# Patient Record
Sex: Female | Born: 1935 | Race: Black or African American | Hispanic: No | Marital: Single | Smoking: Former smoker
Health system: Southern US, Community
[De-identification: ages and names within clinical notes are randomized; demographics above are authoritative.]

## PROBLEM LIST (undated history)

## (undated) DIAGNOSIS — M1990 Unspecified osteoarthritis, unspecified site: Secondary | ICD-10-CM

## (undated) DIAGNOSIS — C801 Malignant (primary) neoplasm, unspecified: Secondary | ICD-10-CM

## (undated) DIAGNOSIS — N189 Chronic kidney disease, unspecified: Secondary | ICD-10-CM

## (undated) DIAGNOSIS — T7840XA Allergy, unspecified, initial encounter: Secondary | ICD-10-CM

## (undated) DIAGNOSIS — J449 Chronic obstructive pulmonary disease, unspecified: Secondary | ICD-10-CM

## (undated) DIAGNOSIS — F32A Depression, unspecified: Secondary | ICD-10-CM

## (undated) DIAGNOSIS — H409 Unspecified glaucoma: Secondary | ICD-10-CM

## (undated) DIAGNOSIS — F329 Major depressive disorder, single episode, unspecified: Secondary | ICD-10-CM

## (undated) DIAGNOSIS — J45909 Unspecified asthma, uncomplicated: Secondary | ICD-10-CM

## (undated) DIAGNOSIS — I10 Essential (primary) hypertension: Secondary | ICD-10-CM

## (undated) HISTORY — DX: Malignant (primary) neoplasm, unspecified: C80.1

## (undated) HISTORY — DX: Major depressive disorder, single episode, unspecified: F32.9

## (undated) HISTORY — DX: Depression, unspecified: F32.A

## (undated) HISTORY — DX: Unspecified osteoarthritis, unspecified site: M19.90

## (undated) HISTORY — DX: Essential (primary) hypertension: I10

## (undated) HISTORY — DX: Unspecified asthma, uncomplicated: J45.909

## (undated) HISTORY — DX: Chronic kidney disease, unspecified: N18.9

## (undated) HISTORY — DX: Unspecified glaucoma: H40.9

## (undated) HISTORY — DX: Allergy, unspecified, initial encounter: T78.40XA

## (undated) HISTORY — DX: Chronic obstructive pulmonary disease, unspecified: J44.9

---

## 1987-12-05 HISTORY — PX: BREAST LUMPECTOMY: SHX2

## 2005-12-21 ENCOUNTER — Ambulatory Visit: Payer: Self-pay | Admitting: Internal Medicine

## 2006-02-26 ENCOUNTER — Ambulatory Visit: Payer: Self-pay | Admitting: Internal Medicine

## 2007-12-06 LAB — HM COLONOSCOPY: HM Colonoscopy: NORMAL

## 2009-03-01 ENCOUNTER — Ambulatory Visit: Payer: Self-pay | Admitting: Internal Medicine

## 2010-03-30 ENCOUNTER — Ambulatory Visit: Payer: Self-pay | Admitting: Internal Medicine

## 2010-08-17 ENCOUNTER — Ambulatory Visit: Payer: Self-pay | Admitting: Internal Medicine

## 2010-11-18 ENCOUNTER — Emergency Department: Payer: Self-pay | Admitting: Emergency Medicine

## 2011-06-21 ENCOUNTER — Ambulatory Visit: Payer: Self-pay

## 2011-12-28 DIAGNOSIS — M1990 Unspecified osteoarthritis, unspecified site: Secondary | ICD-10-CM | POA: Diagnosis not present

## 2011-12-28 DIAGNOSIS — Z79899 Other long term (current) drug therapy: Secondary | ICD-10-CM | POA: Diagnosis not present

## 2011-12-28 DIAGNOSIS — J449 Chronic obstructive pulmonary disease, unspecified: Secondary | ICD-10-CM | POA: Diagnosis not present

## 2012-04-02 DIAGNOSIS — Z79899 Other long term (current) drug therapy: Secondary | ICD-10-CM | POA: Diagnosis not present

## 2012-04-02 DIAGNOSIS — J4489 Other specified chronic obstructive pulmonary disease: Secondary | ICD-10-CM | POA: Diagnosis not present

## 2012-04-02 DIAGNOSIS — Z8249 Family history of ischemic heart disease and other diseases of the circulatory system: Secondary | ICD-10-CM | POA: Diagnosis not present

## 2012-04-02 DIAGNOSIS — J449 Chronic obstructive pulmonary disease, unspecified: Secondary | ICD-10-CM | POA: Diagnosis not present

## 2012-08-15 DIAGNOSIS — I4901 Ventricular fibrillation: Secondary | ICD-10-CM | POA: Diagnosis not present

## 2012-08-15 DIAGNOSIS — I5022 Chronic systolic (congestive) heart failure: Secondary | ICD-10-CM | POA: Diagnosis not present

## 2012-08-15 DIAGNOSIS — J209 Acute bronchitis, unspecified: Secondary | ICD-10-CM | POA: Diagnosis not present

## 2012-08-15 DIAGNOSIS — Z79899 Other long term (current) drug therapy: Secondary | ICD-10-CM | POA: Diagnosis not present

## 2012-08-22 ENCOUNTER — Ambulatory Visit (HOSPITAL_COMMUNITY)
Admission: RE | Admit: 2012-08-22 | Discharge: 2012-08-22 | Disposition: A | Discharge status: Home / Self Care | Payer: Medicare Other | Source: Ambulatory Visit | Attending: Pulmonary Disease | Admitting: Pulmonary Disease

## 2012-08-22 DIAGNOSIS — I493 Ventricular premature depolarization: Secondary | ICD-10-CM

## 2012-08-22 DIAGNOSIS — I079 Rheumatic tricuspid valve disease, unspecified: Secondary | ICD-10-CM | POA: Diagnosis not present

## 2012-08-22 DIAGNOSIS — R0609 Other forms of dyspnea: Secondary | ICD-10-CM

## 2012-08-22 DIAGNOSIS — R0989 Other specified symptoms and signs involving the circulatory and respiratory systems: Secondary | ICD-10-CM | POA: Insufficient documentation

## 2012-08-22 DIAGNOSIS — R0602 Shortness of breath: Secondary | ICD-10-CM

## 2012-08-22 NOTE — Progress Notes (Signed)
  Echocardiogram 2D Echocardiogram has been performed.  Laura Nicholson 08/22/2012, 2:12 PM

## 2012-09-03 DIAGNOSIS — I5022 Chronic systolic (congestive) heart failure: Secondary | ICD-10-CM | POA: Diagnosis not present

## 2012-09-03 DIAGNOSIS — M1990 Unspecified osteoarthritis, unspecified site: Secondary | ICD-10-CM | POA: Diagnosis not present

## 2012-09-03 DIAGNOSIS — J449 Chronic obstructive pulmonary disease, unspecified: Secondary | ICD-10-CM | POA: Diagnosis not present

## 2012-09-03 DIAGNOSIS — Z79899 Other long term (current) drug therapy: Secondary | ICD-10-CM | POA: Diagnosis not present

## 2012-09-03 DIAGNOSIS — R9431 Abnormal electrocardiogram [ECG] [EKG]: Secondary | ICD-10-CM | POA: Diagnosis not present

## 2012-12-16 DIAGNOSIS — M1990 Unspecified osteoarthritis, unspecified site: Secondary | ICD-10-CM | POA: Diagnosis not present

## 2012-12-16 DIAGNOSIS — Z79899 Other long term (current) drug therapy: Secondary | ICD-10-CM | POA: Diagnosis not present

## 2012-12-16 DIAGNOSIS — J449 Chronic obstructive pulmonary disease, unspecified: Secondary | ICD-10-CM | POA: Diagnosis not present

## 2013-01-27 DIAGNOSIS — M19049 Primary osteoarthritis, unspecified hand: Secondary | ICD-10-CM | POA: Diagnosis not present

## 2013-01-27 DIAGNOSIS — J449 Chronic obstructive pulmonary disease, unspecified: Secondary | ICD-10-CM | POA: Diagnosis not present

## 2013-01-27 DIAGNOSIS — I10 Essential (primary) hypertension: Secondary | ICD-10-CM | POA: Diagnosis not present

## 2013-01-28 DIAGNOSIS — R0902 Hypoxemia: Secondary | ICD-10-CM | POA: Diagnosis not present

## 2013-01-28 DIAGNOSIS — R0602 Shortness of breath: Secondary | ICD-10-CM | POA: Diagnosis not present

## 2013-01-28 DIAGNOSIS — J449 Chronic obstructive pulmonary disease, unspecified: Secondary | ICD-10-CM | POA: Diagnosis not present

## 2013-04-30 DIAGNOSIS — H4010X0 Unspecified open-angle glaucoma, stage unspecified: Secondary | ICD-10-CM | POA: Diagnosis not present

## 2013-04-30 DIAGNOSIS — R0602 Shortness of breath: Secondary | ICD-10-CM | POA: Diagnosis not present

## 2013-04-30 DIAGNOSIS — J449 Chronic obstructive pulmonary disease, unspecified: Secondary | ICD-10-CM | POA: Diagnosis not present

## 2013-05-09 DIAGNOSIS — H4010X0 Unspecified open-angle glaucoma, stage unspecified: Secondary | ICD-10-CM | POA: Diagnosis not present

## 2013-09-01 DIAGNOSIS — R0902 Hypoxemia: Secondary | ICD-10-CM | POA: Diagnosis not present

## 2013-09-01 DIAGNOSIS — J449 Chronic obstructive pulmonary disease, unspecified: Secondary | ICD-10-CM | POA: Diagnosis not present

## 2013-09-01 DIAGNOSIS — R0609 Other forms of dyspnea: Secondary | ICD-10-CM | POA: Diagnosis not present

## 2013-10-02 DIAGNOSIS — R0902 Hypoxemia: Secondary | ICD-10-CM | POA: Diagnosis not present

## 2013-10-02 DIAGNOSIS — R0609 Other forms of dyspnea: Secondary | ICD-10-CM | POA: Diagnosis not present

## 2013-10-02 DIAGNOSIS — J449 Chronic obstructive pulmonary disease, unspecified: Secondary | ICD-10-CM | POA: Diagnosis not present

## 2013-10-06 DIAGNOSIS — J449 Chronic obstructive pulmonary disease, unspecified: Secondary | ICD-10-CM | POA: Diagnosis not present

## 2013-10-06 DIAGNOSIS — M19049 Primary osteoarthritis, unspecified hand: Secondary | ICD-10-CM | POA: Diagnosis not present

## 2013-10-06 DIAGNOSIS — Z853 Personal history of malignant neoplasm of breast: Secondary | ICD-10-CM | POA: Diagnosis not present

## 2013-10-06 DIAGNOSIS — I10 Essential (primary) hypertension: Secondary | ICD-10-CM | POA: Diagnosis not present

## 2013-10-20 DIAGNOSIS — J449 Chronic obstructive pulmonary disease, unspecified: Secondary | ICD-10-CM | POA: Diagnosis not present

## 2013-10-20 DIAGNOSIS — R0609 Other forms of dyspnea: Secondary | ICD-10-CM | POA: Diagnosis not present

## 2013-10-20 DIAGNOSIS — Z23 Encounter for immunization: Secondary | ICD-10-CM | POA: Diagnosis not present

## 2013-10-20 DIAGNOSIS — R0902 Hypoxemia: Secondary | ICD-10-CM | POA: Diagnosis not present

## 2013-11-04 ENCOUNTER — Ambulatory Visit: Payer: Self-pay | Admitting: Internal Medicine

## 2013-11-04 DIAGNOSIS — Z1231 Encounter for screening mammogram for malignant neoplasm of breast: Secondary | ICD-10-CM | POA: Diagnosis not present

## 2013-11-04 LAB — HM MAMMOGRAPHY: HM MAMMO: NORMAL

## 2013-11-07 DIAGNOSIS — H4010X0 Unspecified open-angle glaucoma, stage unspecified: Secondary | ICD-10-CM | POA: Diagnosis not present

## 2014-02-18 DIAGNOSIS — R5381 Other malaise: Secondary | ICD-10-CM | POA: Diagnosis not present

## 2014-02-18 DIAGNOSIS — R5383 Other fatigue: Secondary | ICD-10-CM | POA: Diagnosis not present

## 2014-02-18 DIAGNOSIS — J449 Chronic obstructive pulmonary disease, unspecified: Secondary | ICD-10-CM | POA: Diagnosis not present

## 2014-04-07 DIAGNOSIS — Z0000 Encounter for general adult medical examination without abnormal findings: Secondary | ICD-10-CM | POA: Diagnosis not present

## 2014-04-07 DIAGNOSIS — F3289 Other specified depressive episodes: Secondary | ICD-10-CM | POA: Diagnosis not present

## 2014-04-07 DIAGNOSIS — Z853 Personal history of malignant neoplasm of breast: Secondary | ICD-10-CM | POA: Diagnosis not present

## 2014-04-07 DIAGNOSIS — J449 Chronic obstructive pulmonary disease, unspecified: Secondary | ICD-10-CM | POA: Diagnosis not present

## 2014-04-07 DIAGNOSIS — F329 Major depressive disorder, single episode, unspecified: Secondary | ICD-10-CM | POA: Diagnosis not present

## 2014-04-07 DIAGNOSIS — I10 Essential (primary) hypertension: Secondary | ICD-10-CM | POA: Diagnosis not present

## 2014-04-07 LAB — BASIC METABOLIC PANEL
BUN: 23 mg/dL — AB (ref 4–21)
CREATININE: 1.3 mg/dL — AB (ref <=1.1)

## 2014-04-07 LAB — CBC AND DIFFERENTIAL: Hemoglobin: 13.6 g/dL (ref 12.0–16.0)

## 2014-04-07 LAB — TSH: TSH: 1.9 u[IU]/mL (ref <=5.90)

## 2014-04-07 LAB — LIPID PANEL
CHOLESTEROL: 183 mg/dL (ref 0–200)
HDL: 67 mg/dL (ref 35–70)
LDL Cholesterol: 97 mg/dL

## 2014-05-06 DIAGNOSIS — H4010X0 Unspecified open-angle glaucoma, stage unspecified: Secondary | ICD-10-CM | POA: Diagnosis not present

## 2014-05-20 DIAGNOSIS — R0989 Other specified symptoms and signs involving the circulatory and respiratory systems: Secondary | ICD-10-CM | POA: Diagnosis not present

## 2014-05-20 DIAGNOSIS — I951 Orthostatic hypotension: Secondary | ICD-10-CM | POA: Diagnosis not present

## 2014-05-20 DIAGNOSIS — R011 Cardiac murmur, unspecified: Secondary | ICD-10-CM | POA: Diagnosis not present

## 2014-05-20 DIAGNOSIS — R42 Dizziness and giddiness: Secondary | ICD-10-CM | POA: Diagnosis not present

## 2014-05-27 DIAGNOSIS — F329 Major depressive disorder, single episode, unspecified: Secondary | ICD-10-CM | POA: Diagnosis not present

## 2014-05-27 DIAGNOSIS — F3289 Other specified depressive episodes: Secondary | ICD-10-CM | POA: Diagnosis not present

## 2014-05-27 DIAGNOSIS — J309 Allergic rhinitis, unspecified: Secondary | ICD-10-CM | POA: Diagnosis not present

## 2014-05-27 DIAGNOSIS — R42 Dizziness and giddiness: Secondary | ICD-10-CM | POA: Diagnosis not present

## 2014-06-24 DIAGNOSIS — R0609 Other forms of dyspnea: Secondary | ICD-10-CM | POA: Diagnosis not present

## 2014-06-24 DIAGNOSIS — J449 Chronic obstructive pulmonary disease, unspecified: Secondary | ICD-10-CM | POA: Diagnosis not present

## 2014-06-24 DIAGNOSIS — R0902 Hypoxemia: Secondary | ICD-10-CM | POA: Diagnosis not present

## 2014-06-24 DIAGNOSIS — R0989 Other specified symptoms and signs involving the circulatory and respiratory systems: Secondary | ICD-10-CM | POA: Diagnosis not present

## 2014-07-28 ENCOUNTER — Encounter: Payer: Self-pay | Admitting: Specialist

## 2014-07-28 DIAGNOSIS — J449 Chronic obstructive pulmonary disease, unspecified: Secondary | ICD-10-CM | POA: Diagnosis not present

## 2014-07-28 DIAGNOSIS — Z5189 Encounter for other specified aftercare: Secondary | ICD-10-CM | POA: Diagnosis not present

## 2014-08-04 ENCOUNTER — Encounter: Payer: Self-pay | Admitting: Specialist

## 2014-08-04 DIAGNOSIS — J449 Chronic obstructive pulmonary disease, unspecified: Secondary | ICD-10-CM | POA: Diagnosis not present

## 2014-08-04 DIAGNOSIS — Z5189 Encounter for other specified aftercare: Secondary | ICD-10-CM | POA: Diagnosis not present

## 2014-08-05 DIAGNOSIS — Z5189 Encounter for other specified aftercare: Secondary | ICD-10-CM | POA: Diagnosis not present

## 2014-08-05 DIAGNOSIS — J449 Chronic obstructive pulmonary disease, unspecified: Secondary | ICD-10-CM | POA: Diagnosis not present

## 2014-08-20 DIAGNOSIS — R0609 Other forms of dyspnea: Secondary | ICD-10-CM | POA: Diagnosis not present

## 2014-08-20 DIAGNOSIS — R0902 Hypoxemia: Secondary | ICD-10-CM | POA: Diagnosis not present

## 2014-08-20 DIAGNOSIS — J449 Chronic obstructive pulmonary disease, unspecified: Secondary | ICD-10-CM | POA: Diagnosis not present

## 2015-05-06 DIAGNOSIS — J449 Chronic obstructive pulmonary disease, unspecified: Secondary | ICD-10-CM | POA: Diagnosis not present

## 2015-05-06 DIAGNOSIS — R600 Localized edema: Secondary | ICD-10-CM | POA: Diagnosis not present

## 2015-05-06 DIAGNOSIS — G4700 Insomnia, unspecified: Secondary | ICD-10-CM | POA: Diagnosis not present

## 2015-05-06 DIAGNOSIS — M1990 Unspecified osteoarthritis, unspecified site: Secondary | ICD-10-CM | POA: Diagnosis not present

## 2015-05-07 ENCOUNTER — Other Ambulatory Visit: Payer: Self-pay | Admitting: Internal Medicine

## 2015-05-08 ENCOUNTER — Encounter: Payer: Self-pay | Admitting: Internal Medicine

## 2015-05-08 ENCOUNTER — Other Ambulatory Visit: Payer: Self-pay | Admitting: Internal Medicine

## 2015-05-08 DIAGNOSIS — M19049 Primary osteoarthritis, unspecified hand: Secondary | ICD-10-CM | POA: Insufficient documentation

## 2015-05-08 DIAGNOSIS — Z853 Personal history of malignant neoplasm of breast: Secondary | ICD-10-CM | POA: Insufficient documentation

## 2015-05-08 DIAGNOSIS — I10 Essential (primary) hypertension: Secondary | ICD-10-CM | POA: Insufficient documentation

## 2015-05-08 DIAGNOSIS — F325 Major depressive disorder, single episode, in full remission: Secondary | ICD-10-CM | POA: Insufficient documentation

## 2015-05-08 DIAGNOSIS — J449 Chronic obstructive pulmonary disease, unspecified: Secondary | ICD-10-CM | POA: Insufficient documentation

## 2015-05-08 DIAGNOSIS — J309 Allergic rhinitis, unspecified: Secondary | ICD-10-CM | POA: Insufficient documentation

## 2015-05-08 DIAGNOSIS — D696 Thrombocytopenia, unspecified: Secondary | ICD-10-CM | POA: Insufficient documentation

## 2015-05-18 ENCOUNTER — Ambulatory Visit
Admission: RE | Admit: 2015-05-18 | Discharge: 2015-05-18 | Disposition: A | Discharge status: Home / Self Care | Payer: Medicare Other | Source: Ambulatory Visit | Attending: Internal Medicine | Admitting: Internal Medicine

## 2015-05-18 ENCOUNTER — Other Ambulatory Visit: Payer: Self-pay | Admitting: Internal Medicine

## 2015-05-18 DIAGNOSIS — J439 Emphysema, unspecified: Secondary | ICD-10-CM | POA: Diagnosis not present

## 2015-05-18 DIAGNOSIS — J449 Chronic obstructive pulmonary disease, unspecified: Secondary | ICD-10-CM

## 2015-05-18 DIAGNOSIS — R0602 Shortness of breath: Secondary | ICD-10-CM | POA: Diagnosis not present

## 2015-05-18 DIAGNOSIS — J9611 Chronic respiratory failure with hypoxia: Secondary | ICD-10-CM | POA: Diagnosis not present

## 2015-06-09 DIAGNOSIS — R0602 Shortness of breath: Secondary | ICD-10-CM | POA: Diagnosis not present

## 2015-06-14 DIAGNOSIS — J439 Emphysema, unspecified: Secondary | ICD-10-CM | POA: Diagnosis not present

## 2015-06-14 DIAGNOSIS — J449 Chronic obstructive pulmonary disease, unspecified: Secondary | ICD-10-CM | POA: Diagnosis not present

## 2015-06-14 DIAGNOSIS — J9611 Chronic respiratory failure with hypoxia: Secondary | ICD-10-CM | POA: Diagnosis not present

## 2015-07-29 DIAGNOSIS — H527 Unspecified disorder of refraction: Secondary | ICD-10-CM | POA: Diagnosis not present

## 2015-07-29 DIAGNOSIS — H40003 Preglaucoma, unspecified, bilateral: Secondary | ICD-10-CM | POA: Diagnosis not present

## 2015-07-29 DIAGNOSIS — H25813 Combined forms of age-related cataract, bilateral: Secondary | ICD-10-CM | POA: Diagnosis not present

## 2015-08-11 DIAGNOSIS — H40003 Preglaucoma, unspecified, bilateral: Secondary | ICD-10-CM | POA: Diagnosis not present

## 2015-08-11 DIAGNOSIS — H527 Unspecified disorder of refraction: Secondary | ICD-10-CM | POA: Diagnosis not present

## 2015-08-11 DIAGNOSIS — H25813 Combined forms of age-related cataract, bilateral: Secondary | ICD-10-CM | POA: Diagnosis not present

## 2015-08-17 DIAGNOSIS — J45909 Unspecified asthma, uncomplicated: Secondary | ICD-10-CM | POA: Diagnosis not present

## 2015-08-17 DIAGNOSIS — Z79899 Other long term (current) drug therapy: Secondary | ICD-10-CM | POA: Diagnosis not present

## 2015-08-17 DIAGNOSIS — I10 Essential (primary) hypertension: Secondary | ICD-10-CM | POA: Diagnosis not present

## 2015-08-17 DIAGNOSIS — H25811 Combined forms of age-related cataract, right eye: Secondary | ICD-10-CM | POA: Diagnosis not present

## 2015-08-17 DIAGNOSIS — M1990 Unspecified osteoarthritis, unspecified site: Secondary | ICD-10-CM | POA: Diagnosis not present

## 2015-08-17 DIAGNOSIS — Z87891 Personal history of nicotine dependence: Secondary | ICD-10-CM | POA: Diagnosis not present

## 2015-08-17 DIAGNOSIS — H25819 Combined forms of age-related cataract, unspecified eye: Secondary | ICD-10-CM | POA: Insufficient documentation

## 2015-08-17 DIAGNOSIS — H40003 Preglaucoma, unspecified, bilateral: Secondary | ICD-10-CM | POA: Diagnosis not present

## 2015-08-17 DIAGNOSIS — H527 Unspecified disorder of refraction: Secondary | ICD-10-CM | POA: Diagnosis not present

## 2015-09-21 DIAGNOSIS — J449 Chronic obstructive pulmonary disease, unspecified: Secondary | ICD-10-CM | POA: Diagnosis not present

## 2015-09-21 DIAGNOSIS — J9611 Chronic respiratory failure with hypoxia: Secondary | ICD-10-CM | POA: Diagnosis not present

## 2015-09-21 DIAGNOSIS — R0602 Shortness of breath: Secondary | ICD-10-CM | POA: Diagnosis not present

## 2015-09-27 DIAGNOSIS — Z23 Encounter for immunization: Secondary | ICD-10-CM | POA: Diagnosis not present

## 2015-11-02 DIAGNOSIS — R202 Paresthesia of skin: Secondary | ICD-10-CM | POA: Diagnosis not present

## 2016-01-03 DIAGNOSIS — Z961 Presence of intraocular lens: Secondary | ICD-10-CM | POA: Diagnosis not present

## 2016-01-03 DIAGNOSIS — H25812 Combined forms of age-related cataract, left eye: Secondary | ICD-10-CM | POA: Diagnosis not present

## 2016-01-03 DIAGNOSIS — H401131 Primary open-angle glaucoma, bilateral, mild stage: Secondary | ICD-10-CM | POA: Diagnosis not present

## 2016-01-03 DIAGNOSIS — H527 Unspecified disorder of refraction: Secondary | ICD-10-CM | POA: Diagnosis not present

## 2016-02-04 ENCOUNTER — Ambulatory Visit (INDEPENDENT_AMBULATORY_CARE_PROVIDER_SITE_OTHER): Payer: Self-pay | Admitting: Family Medicine

## 2016-02-04 ENCOUNTER — Encounter: Payer: Self-pay | Admitting: Family Medicine

## 2016-02-04 VITALS — BP 136/90 | HR 92 | Temp 98.3°F | Resp 16 | Ht 64.0 in | Wt 147.0 lb

## 2016-02-04 DIAGNOSIS — Z13820 Encounter for screening for osteoporosis: Secondary | ICD-10-CM | POA: Diagnosis not present

## 2016-02-04 DIAGNOSIS — Z7689 Persons encountering health services in other specified circumstances: Secondary | ICD-10-CM

## 2016-02-04 DIAGNOSIS — H409 Unspecified glaucoma: Secondary | ICD-10-CM | POA: Diagnosis not present

## 2016-02-04 DIAGNOSIS — R011 Cardiac murmur, unspecified: Secondary | ICD-10-CM | POA: Diagnosis not present

## 2016-02-04 DIAGNOSIS — R0902 Hypoxemia: Secondary | ICD-10-CM | POA: Diagnosis not present

## 2016-02-04 DIAGNOSIS — J449 Chronic obstructive pulmonary disease, unspecified: Secondary | ICD-10-CM | POA: Diagnosis not present

## 2016-02-04 DIAGNOSIS — I10 Essential (primary) hypertension: Secondary | ICD-10-CM

## 2016-02-04 DIAGNOSIS — J9612 Chronic respiratory failure with hypercapnia: Secondary | ICD-10-CM

## 2016-02-04 DIAGNOSIS — F325 Major depressive disorder, single episode, in full remission: Secondary | ICD-10-CM | POA: Diagnosis not present

## 2016-02-04 DIAGNOSIS — Z7189 Other specified counseling: Secondary | ICD-10-CM | POA: Diagnosis not present

## 2016-02-04 DIAGNOSIS — J9610 Chronic respiratory failure, unspecified whether with hypoxia or hypercapnia: Secondary | ICD-10-CM | POA: Insufficient documentation

## 2016-02-04 DIAGNOSIS — Z1211 Encounter for screening for malignant neoplasm of colon: Secondary | ICD-10-CM

## 2016-02-04 DIAGNOSIS — J301 Allergic rhinitis due to pollen: Secondary | ICD-10-CM | POA: Diagnosis not present

## 2016-02-04 NOTE — Assessment & Plan Note (Signed)
Appears to be doing well. Takes PRN Tranxene as prescribed by previous provider. Takes very occasionally. 1 bottle per year.

## 2016-02-04 NOTE — Assessment & Plan Note (Signed)
Managed by pulmonology. Wears 2L [place] for hypoxia.

## 2016-02-04 NOTE — Assessment & Plan Note (Signed)
On O2 2LPM daily.

## 2016-02-04 NOTE — Patient Instructions (Signed)
We will get you over to cardiology for evaluation of the heart murmur to have them do an echo.   Please continue to follow with Dr. Humphrey Rolls for your lungs.   The cologuard will be sent to your house. Please follow the instructions.   We will call you about getting your Dexascan set up to look for osteoporosis.   Please check your blood pressure at home. We will recheck in 1 mos.   Your goal blood pressure is 140/90 Work on low salt/sodium diet - goal <1.5gm (1,500mg ) per day. Eat a diet high in fruits/vegetables and whole grains.  Look into mediterranean and DASH diet. Goal activity is 123min/wk of moderate intensity exercise.  This can be split into 30 minute chunks.  If you are not at this level, you can start with smaller 10-15 min increments and slowly build up activity. Look at [email or web address] for more resources

## 2016-02-04 NOTE — Assessment & Plan Note (Signed)
Controlled.  

## 2016-02-04 NOTE — Assessment & Plan Note (Signed)
Controlled without HCTZ. Encouraged pt to check on a regular basis and will review next month. Check CMET. Consider ACE for BP control .

## 2016-02-04 NOTE — Assessment & Plan Note (Signed)
Managed by opthamology.

## 2016-02-04 NOTE — Assessment & Plan Note (Signed)
Followed by Dr. Humphrey Rolls. Seen Q3 mos.

## 2016-02-04 NOTE — Progress Notes (Signed)
Subjective:    Patient ID: Laura Nicholson, female    DOB: 1931/12/10, 82 y.o.   MRN: [id number]  HPI: Laura Nicholson is a 82 y.o. female presenting on 02/04/2016 for Establish Care   HPI  Pt presents to establish care today. [facility]  It has been 2 years since Her last PCP visit. Records from previous provider will be requested and reviewed. Also seeing Dr. Humphrey Rolls for pulmonology. Sees every 3 mos for COPD. Next visit march 8. Current medical problems include:  COPD: Managed by pulmonogy. Diagnosed years ago. On oxygen- since 2003. On 2 LPM- uses as needed for for the last 5-6 years she has used daily. Does not wear to sleep. Using symbicort and albuterol. Using albuterol 3-4 times per day. Symbiocort BID. No productive cough on a regular basis. Short of breath with minimal exertion. Was told she had a mild heart condition. Last EF  60% in 2013 Seasonal allergies: Controlled. Takes OTC meds when needed.  Pedal Edema: Occasional and mild.  Arthritis: Feel in 2011 and broke her arm. Has some arthritis from the previous injury. Occasional knee pain. Takes icy hot to help. Controls her pain.  Depression:  Takes tranxene as needed. Prescribed by Dr. Katherine Roan in [place].He was a previous primary care provider. Last refilled 6 mos ago.  Glaucoma: Dr. Vickii Penna in [place] manages.  Hypertension: Was previously on HCTZ- is not taking due to medication reaction.  Breast cancer in 1989- lumpectomy. No abnormal mammograms since that time.   Health maintenance:  Shingles vaccination: Declines.  TDAP: unsure. Declines. Pneumovax: 2013.  Prevnar: Given 2015.  Colonoscopy: 2009- normal. Would like to screen with cologuard. Mammogram: Last 2 years ago. Normal. Declines further. Dexa scan: Desires.     Past Medical History  Diagnosis Date  . Allergy   . Arthritis   . Asthma   . Cancer ([place])     in past  . Depression   . COPD (chronic  obstructive pulmonary disease) ([place])   . Glaucoma   . Chronic kidney disease     in past  . Hypertension    Social History   Social History  . Marital Status: Single    Spouse Name: N/A  . Number of Children: N/A  . Years of Education: N/A   Occupational History  . Not on file.   Social History Main Topics  . Smoking status: Former Research scientist (life sciences)  . Smokeless tobacco: Not on file  . Alcohol Use: No  . Drug Use: No  . Sexual Activity: Not on file   Other Topics Concern  . Not on file   Social History Narrative   Family History  Problem Relation Age of Onset  . CAD Father   . Heart disease Father   . Asthma Sister    Current Outpatient Prescriptions on File Prior to Visit  Medication Sig  . albuterol (PROVENTIL HFA;VENTOLIN HFA) 108 (90 BASE) MCG/ACT inhaler Inhale 2 puffs into the lungs 4 (four) times daily as needed.  . budesonide-formoterol (SYMBICORT) 80-4.5 MCG/ACT inhaler Inhale 1 puff into the lungs 2 (two) times daily.  . clorazepate (TRANXENE) 3.75 MG tablet Take 1 tablet by mouth 2 (two) times daily. Pt takes it as needed  . ibuprofen (ADVIL,MOTRIN) 800 MG tablet Take 1 tablet by mouth 2 (two) times daily as needed.  Marland Kitchen ipratropium-albuterol (DUONEB) 0.5-2.5 (3) MG/3ML SOLN Inhale 3 mLs into the lungs QID.  Marland Kitchen hydrochlorothiazide (HYDRODIURIL) 12.5 MG tablet TAKE ONE  TABLET BY MOUTH ONCE DAILY (Patient not taking: Reported on 02/04/2016)   No current facility-administered medications on file prior to visit.    Review of Systems  Constitutional: Negative for fever and chills.  HENT: Negative.   Respiratory: Positive for shortness of breath. Negative for cough, chest tightness and wheezing.   Cardiovascular: Negative for chest pain and leg swelling.  Gastrointestinal: Negative for nausea, vomiting, abdominal pain, diarrhea and constipation.  Endocrine: Negative.  Negative for cold intolerance, heat intolerance, polydipsia, polyphagia and polyuria.  Genitourinary:  Negative for dysuria and difficulty urinating.  Musculoskeletal: Positive for arthralgias (bilateral knees).  Neurological: Negative for dizziness, light-headedness and numbness.  Psychiatric/Behavioral: Negative.    Per HPI unless specifically indicated above     Objective:    BP 136/90 mmHg  Pulse 92  Temp(Src) 98.3 F (36.8 C) (Oral)  Resp 16  Ht 5\' 4"  (1.626 m)  Wt 147 lb (66.679 kg)  BMI 25.22 kg/m2  SpO2 93%  Wt Readings from Last 3 Encounters:  02/04/16 147 lb (66.679 kg)    Physical Exam  Constitutional: She is oriented to person, place, and time. She appears well-developed and well-nourished.  HENT:  Head: Normocephalic and atraumatic.  Neck: Neck supple.  Cardiovascular: Normal rate and regular rhythm.  Exam reveals no gallop and no friction rub.   Murmur heard.  Systolic murmur is present with a grade of 2/6  Pulses:      Radial pulses are 2+ on the right side, and 2+ on the left side.  2/6 systolic murmur heard best at LSB.   Pulmonary/Chest: Effort normal and breath sounds normal. No respiratory distress. She has no decreased breath sounds. She has no wheezes. She has no rhonchi. She has no rales. Chest wall is not dull to percussion. She exhibits no tenderness.  Abdominal: Soft. Normal appearance and bowel sounds are normal. She exhibits no distension and no mass. There is no tenderness. There is no rebound and no guarding.  Musculoskeletal: Normal range of motion. She exhibits no edema or tenderness.  Trace edema ankles.   Lymphadenopathy:    She has no cervical adenopathy.  Neurological: She is alert and oriented to person, place, and time.  Skin: Skin is warm and dry.  Psychiatric: She has a normal mood and affect. Her speech is normal and behavior is normal. Judgment and thought content normal. Cognition and memory are normal.   Results for orders placed or performed in visit on 05/08/15  HM MAMMOGRAPHY  Result Value Ref Range   HM Mammogram Normal     CBC and differential  Result Value Ref Range   Hemoglobin 13.6 12.0 - 16.0 g/dL  Basic metabolic panel  Result Value Ref Range   BUN 23 (A) 4 - 21 mg/dL   Creatinine 1.3 (A) .5 - 1.1 mg/dL  Lipid panel  Result Value Ref Range   Cholesterol 183 0 - 200 mg/dL   HDL 67 35 - 70 mg/dL   LDL Cholesterol 97 mg/dL  TSH  Result Value Ref Range   TSH 1.90 .41 - 5.90 uIU/mL  HM COLONOSCOPY  Result Value Ref Range   HM Colonoscopy Normal per patient at [place]:   Problem List Items Addressed This Visit      Cardiovascular and Mediastinum   Essential (primary) hypertension    Controlled without HCTZ. Encouraged pt to check on a regular basis and will review next month. Check CMET. Consider ACE for  BP control .       Relevant Orders   Comprehensive metabolic panel     Respiratory   Allergic rhinitis    Controlled.       COPD, severe ([place])    Followed by Dr. Humphrey Rolls. Seen Q3 mos.       Hypoxia    On O2 2LPM daily.       Chronic respiratory failure ([place])    Managed by pulmonology. Wears 2L [place] for hypoxia.         Other   Major depressive disorder, single episode in full remission ([place])    Appears to be doing well. Takes PRN Tranxene as prescribed by previous provider. Takes very occasionally. 1 bottle per year.        Glaucoma    Managed by opthamology.       Relevant Medications   latanoprost (XALATAN) 0.005 % ophthalmic solution    Other Visit Diagnoses    Encounter to establish care    -  Primary    Screening for osteoporosis        Pt desires dexa scan.     Relevant Orders    DG Bone Density    Screening for colon cancer        Relevant Orders    Cologuard    Cardiac murmur        Refer to cardiology for evaluation and management.     Relevant Orders    Ambulatory referral to Cardiology       Meds ordered this encounter  Medications  . latanoprost (XALATAN) 0.005 % ophthalmic solution    Sig: Place 1 drop into both eyes at  bedtime.      Follow up plan: Return in about 4 weeks (around 03/03/2016) for HTN.

## 2016-02-23 ENCOUNTER — Ambulatory Visit (INDEPENDENT_AMBULATORY_CARE_PROVIDER_SITE_OTHER): Payer: Medicare Other | Admitting: Cardiology

## 2016-02-23 ENCOUNTER — Encounter (INDEPENDENT_AMBULATORY_CARE_PROVIDER_SITE_OTHER): Payer: Self-pay

## 2016-02-23 ENCOUNTER — Encounter: Payer: Self-pay | Admitting: Cardiology

## 2016-02-23 VITALS — BP 152/77 | HR 91 | Ht 64.0 in | Wt 148.5 lb

## 2016-02-23 DIAGNOSIS — R011 Cardiac murmur, unspecified: Secondary | ICD-10-CM | POA: Diagnosis not present

## 2016-02-23 DIAGNOSIS — I10 Essential (primary) hypertension: Secondary | ICD-10-CM

## 2016-02-23 NOTE — Progress Notes (Signed)
Cardiology Office Note   Date:  02/23/2016   ID:  Laura Nicholson, DOB [birth year], MRN [id number]  Referring Doctor:  Letta Median, MD   Cardiologist:   Wende Bushy, MD   Reason for consultation:  Chief Complaint  Patient presents with  . other    Heart Murmur . Meds reviewed verbally with pt.      History of Present Illness: Laura Nicholson is a 82 y.o. female who presents for Evaluation for heart murmur. On office visits with PCP, murmur was appreciated. [facility] for cardiology evaluation. Patient does not complain of any chest pains, palpitations, headache, fever, cough, colds, abdominal pain,, PND, orthopnea, edema. She does have shortness breath but this improves with use of her inhalers.    ROS:  Please see the history of present illness. Aside from mentioned under HPI, all other systems are reviewed and negative.     Past Medical History  Diagnosis Date  . Allergy   . Arthritis   . Asthma   . Cancer ([place])     in past  . Depression   . COPD (chronic obstructive pulmonary disease) ([place])   . Glaucoma   . Chronic kidney disease     in past  . Hypertension     Past Surgical History  Procedure Laterality Date  . Breast lumpectomy Left 1989     reports that she has quit smoking. She does not have any smokeless tobacco history on file. She reports that she does not drink alcohol or use illicit drugs.   family history includes Asthma in her sister; CAD in her father; Heart disease in her father.   Current Outpatient Prescriptions  Medication Sig Dispense Refill  . albuterol (PROVENTIL HFA;VENTOLIN HFA) 108 (90 BASE) MCG/ACT inhaler Inhale 2 puffs into the lungs 4 (four) times daily as needed.    Marland Kitchen aspirin 325 MG tablet Take 325 mg by mouth every other day.    . budesonide-formoterol (SYMBICORT) 80-4.5 MCG/ACT inhaler Inhale 1 puff into the lungs 2 (two) times daily.    . clorazepate (TRANXENE) 3.75 MG tablet Take 1 tablet by mouth 2 (two) times daily.  Pt takes it as needed    . ibuprofen (ADVIL,MOTRIN) 800 MG tablet Take 1 tablet by mouth 2 (two) times daily as needed.    Marland Kitchen ipratropium-albuterol (DUONEB) 0.5-2.5 (3) MG/3ML SOLN Inhale 3 mLs into the lungs QID.    Marland Kitchen latanoprost (XALATAN) 0.005 % ophthalmic solution Place 1 drop into both eyes at bedtime.    . hydrochlorothiazide (HYDRODIURIL) 12.5 MG tablet TAKE ONE TABLET BY MOUTH ONCE DAILY (Patient not taking: Reported on 02/04/2016) 30 tablet 0   No current facility-administered medications for this visit.    Allergies: Review of patient's allergies indicates no known allergies.    PHYSICAL EXAM: VS:  BP 152/77 mmHg  Pulse 91  Ht 5\' 4"  (1.626 m)  Wt 148 lb 8 oz (67.359 kg)  BMI 25.48 kg/m2 , Body mass index is 25.48 kg/(m^2). Wt Readings from Last 3 Encounters:  02/23/16 148 lb 8 oz (67.359 kg)  02/04/16 147 lb (66.679 kg)    GENERAL:  well developed, well nourished, not in acute distress HEENT: normocephalic, pink conjunctivae, anicteric sclerae, no xanthelasma, normal dentition, oropharynx clear NECK:  no neck vein engorgement, JVP normal, no hepatojugular reflux, carotid upstroke brisk and symmetric, no bruit, no thyromegaly, no lymphadenopathy LUNGS:  good respiratory effort, clear to auscultation bilaterally CV:  PMI not displaced, no thrills, no lifts, S1 and  S2 within normal limits, no palpable S3 or S4, no rubs, no gallops, soft systolic murmur, nonradiating  ABD:  Soft, nontender, nondistended, normoactive bowel sounds, no abdominal aortic bruit, no hepatomegaly, no splenomegaly MS: nontender back, no kyphosis, no scoliosis, no joint deformities EXT:  2+ DP/PT pulses, no edema, no varicosities, no cyanosis, no clubbing SKIN: warm, nondiaphoretic, normal turgor, no ulcers NEUROPSYCH: alert, oriented to person, place, and time, sensory/motor grossly intact, normal mood, appropriate affect  Recent Labs: No results found for requested labs within last 365 days.   Lipid  Panel    Component Value Date/Time   CHOL 183 04/07/2014   HDL 67 04/07/2014   LDLCALC 97 04/07/2014     Other studies Reviewed:  EKG:  EKG is ordered today, 02/23/2016. The ekg ordered was personally reviewed by me and it reveals sinus rhythm, 91 BPM, presence of artifact.   Additional studies/ records that were reviewed personally reviewed by me today include: None available   ASSESSMENT AND PLAN:  Cardiac murmur, systolic Recommend echocardiogram for further evaluation  Hypertension Recommend blood pressure log. Continue to monitor   Current medicines are reviewed at length with the patient today.  The patient does not have concerns regarding medicines.  Labs/ tests ordered today include:  Orders Placed This Encounter  Procedures  . EKG 12-Lead  . Echocardiogram    I had a lengthy and detailed discussion with the patient regarding diagnoses, prognosis, diagnostic options, treatment options.  I counseled the patient on importance of lifestyle modification including heart healthy diet, regular physical activity.  Disposition:   FU with undersigned after tests   Signed, Wende Bushy, MD  02/23/2016 5:33 PM    [place]

## 2016-02-23 NOTE — Patient Instructions (Addendum)
Medication Instructions:  Your physician recommends that you continue on your current medications as directed. Please refer to the Current Medication list given to you today.   Labwork: None Ordered  Testing/Procedures: Your physician has requested that you have an echocardiogram. Echocardiography is a painless test that uses sound waves to create images of your heart. It provides your doctor with information about the size and shape of your heart and how well your heart's chambers and valves are working. This procedure takes approximately one hour. There are no restrictions for this procedure.  Date & Time: ________________________________________________________________  Follow-Up: Your physician recommends that you schedule a follow-up appointment after testing to review results.  Date & Time:__________________________________________________________________   Any Other Special Instructions Will Be Listed Below (If Applicable).     If you need a refill on your cardiac medications before your next appointment, please call your pharmacy.  Echocardiogram An echocardiogram, or echocardiography, uses sound waves (ultrasound) to produce an image of your heart. The echocardiogram is simple, painless, obtained within a short period of time, and offers valuable information to your health care provider. The images from an echocardiogram can provide information such as:  Evidence of coronary artery disease (CAD).  Heart size.  Heart muscle function.  Heart valve function.  Aneurysm detection.  Evidence of a past heart attack.  Fluid buildup around the heart.  Heart muscle thickening.  Assess heart valve function. LET [facility] CARE PROVIDER KNOW ABOUT:  Any allergies you have.  All medicines you are taking, including vitamins, herbs, eye drops, creams, and over-the-counter medicines.  Previous problems you or members of your family have had with the use of  anesthetics.  Any blood disorders you have.  Previous surgeries you have had.  Medical conditions you have.  Possibility of pregnancy, if this applies. BEFORE THE PROCEDURE  No special preparation is needed. Eat and drink normally.  PROCEDURE   In order to produce an image of your heart, gel will be applied to your chest and a wand-like tool (transducer) will be moved over your chest. The gel will help transmit the sound waves from the transducer. The sound waves will harmlessly bounce off your heart to allow the heart images to be captured in real-time motion. These images will then be recorded.  You may need an IV to receive a medicine that improves the quality of the pictures. AFTER THE PROCEDURE You may return to your normal schedule including diet, activities, and medicines, unless your health care provider tells you otherwise.   This information is not intended to replace advice given to you by your health care provider. Make sure you discuss any questions you have with your health care provider.   Document Released: 11/17/2000 Document Revised: 12/11/2014 Document Reviewed: 07/28/2013 Elsevier Interactive Patient Education [employer].

## 2016-02-25 ENCOUNTER — Encounter (INDEPENDENT_AMBULATORY_CARE_PROVIDER_SITE_OTHER): Payer: Self-pay

## 2016-02-25 ENCOUNTER — Ambulatory Visit (INDEPENDENT_AMBULATORY_CARE_PROVIDER_SITE_OTHER): Payer: Medicare Other

## 2016-02-25 ENCOUNTER — Other Ambulatory Visit: Payer: Self-pay

## 2016-02-25 DIAGNOSIS — R011 Cardiac murmur, unspecified: Secondary | ICD-10-CM

## 2016-02-28 ENCOUNTER — Telehealth: Payer: Self-pay | Admitting: *Deleted

## 2016-02-28 ENCOUNTER — Other Ambulatory Visit: Payer: Self-pay | Admitting: *Deleted

## 2016-02-28 DIAGNOSIS — R0602 Shortness of breath: Secondary | ICD-10-CM

## 2016-02-28 NOTE — Telephone Encounter (Signed)
-----   Message from Wende Bushy, MD sent at 02/28/2016 10:19 AM EDT ----- Please let patient know that overall heart function is normal. However, pressure inside the heart is elevated. Recommend further workup with CT A of the chest to rule out clot and lungs and to look at the lungs (PE). Before this is done, we will need to check her BMP and make sure her renal function is within the normal limits. Thank you.

## 2016-02-28 NOTE — Telephone Encounter (Signed)
Spoke with patient regarding echo results and need to schedule BMP and CT Angiogram. Let her know that we would be calling to schedule these appointments and she verbalized understanding and had no further questions at this time.

## 2016-02-29 DIAGNOSIS — J449 Chronic obstructive pulmonary disease, unspecified: Secondary | ICD-10-CM | POA: Diagnosis not present

## 2016-02-29 DIAGNOSIS — J9611 Chronic respiratory failure with hypoxia: Secondary | ICD-10-CM | POA: Diagnosis not present

## 2016-02-29 DIAGNOSIS — R0602 Shortness of breath: Secondary | ICD-10-CM | POA: Diagnosis not present

## 2016-03-02 NOTE — Telephone Encounter (Signed)
Spoke with patient regarding her CT angiogram. Provided her with appointment date, time, and location to have that done. March 14, 2016 at 9:00AM with nothing to eat or drink for 4 hours prior to test. She verbalized understanding of all instructions and let her know to call back if any questions.

## 2016-03-03 ENCOUNTER — Other Ambulatory Visit (INDEPENDENT_AMBULATORY_CARE_PROVIDER_SITE_OTHER): Payer: Medicare Other

## 2016-03-03 ENCOUNTER — Ambulatory Visit (INDEPENDENT_AMBULATORY_CARE_PROVIDER_SITE_OTHER): Payer: Medicare Other | Admitting: Family Medicine

## 2016-03-03 ENCOUNTER — Encounter: Payer: Self-pay | Admitting: Family Medicine

## 2016-03-03 VITALS — BP 157/80 | HR 101 | Temp 97.9°F | Resp 16 | Ht 64.0 in | Wt 145.0 lb

## 2016-03-03 DIAGNOSIS — J449 Chronic obstructive pulmonary disease, unspecified: Secondary | ICD-10-CM

## 2016-03-03 DIAGNOSIS — R0602 Shortness of breath: Secondary | ICD-10-CM

## 2016-03-03 DIAGNOSIS — I10 Essential (primary) hypertension: Secondary | ICD-10-CM | POA: Diagnosis not present

## 2016-03-03 MED ORDER — HYDROCHLOROTHIAZIDE 12.5 MG PO TABS: 12.5000 mg | ORAL_TABLET | Freq: Every day | ORAL | Status: DC

## 2016-03-03 NOTE — Assessment & Plan Note (Signed)
Elevated today. Restart HCTZ at 12.5mg . Encouraged pt to check BP at home daily. BMET pending from cardiology. Recheck 1 mos.

## 2016-03-03 NOTE — Assessment & Plan Note (Signed)
Stable today. Saw pulmonology on Tuesday- doing well.

## 2016-03-03 NOTE — Patient Instructions (Signed)
Your goal blood pressure is 150/90 Work on low salt/sodium diet - goal <1.5gm (1,500mg ) per day. Eat a diet high in fruits/vegetables and whole grains.  Look into mediterranean and DASH diet. Goal activity is 154min/wk of moderate intensity exercise.  This can be split into 30 minute chunks.  If you are not at this level, you can start with smaller 10-15 min increments and slowly build up activity. Look at [email or web address] for more resources  Please seek immediate medical attention at ER or Urgent Care if you develop: Chest pain, pressure or tightness. Shortness of breath accompanied by nausea or diaphoresis Visual changes Numbness or tingling on one side of the body Facial droop Altered mental status Or any concerning symptoms.

## 2016-03-03 NOTE — Progress Notes (Signed)
Subjective:    Patient ID: Laura Nicholson, female    DOB: 1932/04/28, 82 y.o.   MRN: [id number]  HPI: Laura Nicholson is a 82 y.o. female presenting on 03/03/2016 for Hypertension   HPI  Pt presents for follow-up of blood pressure.  Her BP is elevated today. She was taking HCTZ once daily to help with blood pressure but has since stopped the medication over past several years. BP elevated 152/77 at cardiology visit. She has a cuff at home but not checking regularly because she thinks it is not working. No Chest pain or HA. Had Echo with cardiology- no structural problems but did find elevated pressure in the heart. Has follow up CT scan on 4/11  Past Medical History  Diagnosis Date  . Allergy   . Arthritis   . Asthma   . Cancer ([place])     in past  . Depression   . COPD (chronic obstructive pulmonary disease) ([place])   . Glaucoma   . Chronic kidney disease     in past  . Hypertension     Current Outpatient Prescriptions on File Prior to Visit  Medication Sig  . albuterol (PROVENTIL HFA;VENTOLIN HFA) 108 (90 BASE) MCG/ACT inhaler Inhale 2 puffs into the lungs 4 (four) times daily as needed.  Marland Kitchen aspirin 325 MG tablet Take 325 mg by mouth every other day.  . budesonide-formoterol (SYMBICORT) 80-4.5 MCG/ACT inhaler Inhale 1 puff into the lungs 2 (two) times daily.  . clorazepate (TRANXENE) 3.75 MG tablet Take 1 tablet by mouth 2 (two) times daily. Pt takes it as needed  . ibuprofen (ADVIL,MOTRIN) 800 MG tablet Take 1 tablet by mouth 2 (two) times daily as needed.  Marland Kitchen ipratropium-albuterol (DUONEB) 0.5-2.5 (3) MG/3ML SOLN Inhale 3 mLs into the lungs QID.  Marland Kitchen latanoprost (XALATAN) 0.005 % ophthalmic solution Place 1 drop into both eyes at bedtime.   No current facility-administered medications on file prior to visit.    Review of Systems  Constitutional: Negative for fever and chills.  HENT: Negative.   Respiratory: Positive for shortness of breath (baseline- O2 dependent COPD).  Negative for cough, chest tightness and wheezing.   Cardiovascular: Negative for chest pain, palpitations and leg swelling.  Gastrointestinal: Negative for nausea, vomiting, abdominal pain, diarrhea and constipation.  Endocrine: Negative.  Negative for cold intolerance, heat intolerance, polydipsia, polyphagia and polyuria.  Genitourinary: Negative for dysuria and difficulty urinating.  Musculoskeletal: Negative.   Neurological: Negative for dizziness, light-headedness and numbness.  Psychiatric/Behavioral: Negative.    Per HPI unless specifically indicated above     Objective:    BP 157/80 mmHg  Pulse 101  Temp(Src) 97.9 F (36.6 C) (Oral)  Resp 16  Ht 5\' 4"  (1.626 m)  Wt 145 lb (65.772 kg)  BMI 24.88 kg/m2  SpO2 95%  Wt Readings from Last 3 Encounters:  03/03/16 145 lb (65.772 kg)  02/23/16 148 lb 8 oz (67.359 kg)  02/04/16 147 lb (66.679 kg)    Physical Exam  Constitutional: She is oriented to person, place, and time. She appears well-developed and well-nourished.  HENT:  Head: Normocephalic and atraumatic.  Neck: Neck supple.  Cardiovascular: Normal rate and regular rhythm.  Exam reveals no gallop and no friction rub.   Murmur heard.  Systolic murmur is present with a grade of 2/6  Murmur heard best LSB- does not radiate.   Pulmonary/Chest: Effort normal and breath sounds normal. She has no wheezes. She exhibits no tenderness.  Abdominal: Soft. Normal appearance and bowel  sounds are normal. She exhibits no distension and no mass. There is no tenderness. There is no rebound and no guarding.  Musculoskeletal: Normal range of motion. She exhibits no edema or tenderness.  Lymphadenopathy:    She has no cervical adenopathy.  Neurological: She is alert and oriented to person, place, and time.  Skin: Skin is warm and dry.   Results for orders placed or performed in visit on 05/08/15  HM MAMMOGRAPHY  Result Value Ref Range   HM Mammogram Normal   CBC and differential    Result Value Ref Range   Hemoglobin 13.6 12.0 - 16.0 g/dL  Basic metabolic panel  Result Value Ref Range   BUN 23 (A) 4 - 21 mg/dL   Creatinine 1.3 (A) .5 - 1.1 mg/dL  Lipid panel  Result Value Ref Range   Cholesterol 183 0 - 200 mg/dL   HDL 67 35 - 70 mg/dL   LDL Cholesterol 97 mg/dL  TSH  Result Value Ref Range   TSH 1.90 .41 - 5.90 uIU/mL  HM COLONOSCOPY  Result Value Ref Range   HM Colonoscopy Normal per patient at [facility]:   Problem List Items Addressed This Visit      Cardiovascular and Mediastinum   Essential (primary) hypertension - Primary    Elevated today. Restart HCTZ at 12.5mg . Encouraged pt to check BP at home daily. BMET pending from cardiology. Recheck 1 mos.       Relevant Medications   hydrochlorothiazide (HYDRODIURIL) 12.5 MG tablet     Respiratory   COPD, severe (HCC)    Stable today. Saw pulmonology on Tuesday- doing well.          Meds ordered this encounter  Medications  . hydrochlorothiazide (HYDRODIURIL) 12.5 MG tablet    Sig: Take 1 tablet (12.5 mg total) by mouth daily.    Dispense:  30 tablet    Refill:  5    Order Specific Question:  Supervising Provider    Answer:  Arlis Porta [id number]      Follow up plan: Return in about 4 weeks (around 03/31/2016) for HTN.

## 2016-03-04 LAB — BASIC METABOLIC PANEL
BUN/Creatinine Ratio: 18 (ref 11–26)
BUN: 16 mg/dL (ref 8–27)
CO2: 19 mmol/L (ref 18–29)
CREATININE: 0.9 mg/dL (ref 0.57–1.00)
Calcium: 9.1 mg/dL (ref 8.7–10.3)
Chloride: 108 mmol/L — ABNORMAL HIGH (ref 96–106)
GFR, EST AFRICAN AMERICAN: 69 mL/min/{1.73_m2} (ref >59)
GFR, EST NON AFRICAN AMERICAN: 60 mL/min/{1.73_m2} (ref >59)
Glucose: 95 mg/dL (ref 65–99)
Potassium: 4.5 mmol/L (ref 3.5–5.2)
SODIUM: 147 mmol/L — AB (ref 134–144)

## 2016-03-08 ENCOUNTER — Ambulatory Visit: Payer: PRIVATE HEALTH INSURANCE | Admitting: Cardiology

## 2016-03-09 DIAGNOSIS — M8588 Other specified disorders of bone density and structure, other site: Secondary | ICD-10-CM | POA: Diagnosis not present

## 2016-03-09 DIAGNOSIS — M85862 Other specified disorders of bone density and structure, left lower leg: Secondary | ICD-10-CM | POA: Diagnosis not present

## 2016-03-14 ENCOUNTER — Ambulatory Visit
Admission: RE | Admit: 2016-03-14 | Discharge: 2016-03-14 | Disposition: A | Discharge status: Home / Self Care | Payer: Medicare Other | Source: Ambulatory Visit | Attending: Cardiology | Admitting: Cardiology

## 2016-03-14 DIAGNOSIS — I272 Other secondary pulmonary hypertension: Secondary | ICD-10-CM | POA: Diagnosis not present

## 2016-03-14 DIAGNOSIS — R0602 Shortness of breath: Secondary | ICD-10-CM | POA: Diagnosis not present

## 2016-03-14 DIAGNOSIS — J449 Chronic obstructive pulmonary disease, unspecified: Secondary | ICD-10-CM | POA: Insufficient documentation

## 2016-03-14 DIAGNOSIS — J439 Emphysema, unspecified: Secondary | ICD-10-CM | POA: Diagnosis not present

## 2016-03-14 MED ORDER — IOPAMIDOL (ISOVUE-370) INJECTION 76%
75.0000 mL | Freq: Once | INTRAVENOUS | Status: AC | PRN
  Administered 2016-03-14: 75 mL via INTRAVENOUS

## 2016-03-23 ENCOUNTER — Ambulatory Visit (INDEPENDENT_AMBULATORY_CARE_PROVIDER_SITE_OTHER): Payer: Medicare Other | Admitting: Cardiology

## 2016-03-23 ENCOUNTER — Encounter: Payer: Self-pay | Admitting: Cardiology

## 2016-03-23 VITALS — BP 177/79 | HR 99 | Ht 64.0 in | Wt 145.0 lb

## 2016-03-23 DIAGNOSIS — I272 Other secondary pulmonary hypertension: Secondary | ICD-10-CM | POA: Diagnosis not present

## 2016-03-23 DIAGNOSIS — I10 Essential (primary) hypertension: Secondary | ICD-10-CM | POA: Diagnosis not present

## 2016-03-23 NOTE — Progress Notes (Signed)
Cardiology Office Note   Date:  03/23/2016   ID:  Laura Nicholson, DOB [birth year], MRN [id number]  Referring Doctor:  Leata Mouse, NP   Cardiologist:   Wende Bushy, MD   Reason for consultation:  Chief Complaint  Patient presents with  . other    F/u echo and labs. Meds reviewed verbally with pt.      History of Present Illness: Laura Nicholson is a 82 y.o. female who presents forFollow-up after test.    Patient does not complain of any chest pains, palpitations, headache, fever, cough, colds, abdominal pain,, PND, orthopnea, edema. She does have chronic shortness breath but this improves with use of her inhalers. She has been on oxygen for quite a few years now.   ROS:  Please see the history of present illness. Aside from mentioned under HPI, all other systems are reviewed and negative.     Past Medical History  Diagnosis Date  . Allergy   . Arthritis   . Asthma   . Depression   . COPD (chronic obstructive pulmonary disease) ([place])   . Glaucoma   . Chronic kidney disease     in past  . Hypertension   . Cancer ([place])     in past, 1989 Left Lumpectomy    Past Surgical History  Procedure Laterality Date  . Breast lumpectomy Left 1989     reports that she has quit smoking. She does not have any smokeless tobacco history on file. She reports that she does not drink alcohol or use illicit drugs.   family history includes Asthma in her sister; CAD in her father; Heart disease in her father.   Current Outpatient Prescriptions  Medication Sig Dispense Refill  . albuterol (PROVENTIL HFA;VENTOLIN HFA) 108 (90 BASE) MCG/ACT inhaler Inhale 2 puffs into the lungs 4 (four) times daily as needed.    Marland Kitchen aspirin 325 MG tablet Take 325 mg by mouth every other day.    . budesonide-formoterol (SYMBICORT) 80-4.5 MCG/ACT inhaler Inhale 1 puff into the lungs 2 (two) times daily.    . clorazepate (TRANXENE) 3.75 MG tablet Take 1 tablet by mouth 2 (two) times daily. Pt takes it as  needed    . hydrochlorothiazide (HYDRODIURIL) 12.5 MG tablet Take 1 tablet (12.5 mg total) by mouth daily. 30 tablet 5  . ibuprofen (ADVIL,MOTRIN) 800 MG tablet Take 1 tablet by mouth 2 (two) times daily as needed.    Marland Kitchen ipratropium-albuterol (DUONEB) 0.5-2.5 (3) MG/3ML SOLN Inhale 3 mLs into the lungs QID.    Marland Kitchen latanoprost (XALATAN) 0.005 % ophthalmic solution Place 1 drop into both eyes at bedtime.     No current facility-administered medications for this visit.    Allergies: Review of patient's allergies indicates no known allergies.    PHYSICAL EXAM: VS:  BP 177/79 mmHg  Pulse 99  Ht 5\' 4"  (1.626 m)  Wt 145 lb (65.772 kg)  BMI 24.88 kg/m2  SpO2 92% , Body mass index is 24.88 kg/(m^2). Wt Readings from Last 3 Encounters:  03/23/16 145 lb (65.772 kg)  03/03/16 145 lb (65.772 kg)  02/23/16 148 lb 8 oz (67.359 kg)    GENERAL:  well developed, well nourished, not in acute distress HEENT: normocephalic, pink conjunctivae, anicteric sclerae, no xanthelasma, normal dentition, oropharynx clear NECK:  no neck vein engorgement, JVP normal, no hepatojugular reflux, carotid upstroke brisk and symmetric, no bruit, no thyromegaly, no lymphadenopathy LUNGS:  good respiratory effort, clear to auscultation bilaterally CV:  PMI not  displaced, no thrills, no lifts, S1 and S2 within normal limits, no palpable S3 or S4, no rubs, no gallops, soft systolic murmur, nonradiating  ABD:  Soft, nontender, nondistended, normoactive bowel sounds, no abdominal aortic bruit, no hepatomegaly, no splenomegaly MS: nontender back, no kyphosis, no scoliosis, no joint deformities EXT:  2+ DP/PT pulses, no edema, no varicosities, no cyanosis, no clubbing SKIN: warm, nondiaphoretic, normal turgor, no ulcers NEUROPSYCH: alert, oriented to person, place, and time, sensory/motor grossly intact, normal mood, appropriate affect  Recent Labs: 03/03/2016: BUN 16; Creatinine, Ser 0.90; Potassium 4.5; Sodium 147*   Lipid  Panel    Component Value Date/Time   CHOL 183 04/07/2014   HDL 67 04/07/2014   LDLCALC 97 04/07/2014     Other studies Reviewed:  EKG:  EKG  02/23/2016. The ekg ordered was personally reviewed by me and it reveals sinus rhythm, 91 BPM, presence of artifact.   Additional studies/ records that were reviewed personally reviewed by me today include:  Echocardiogram 02/25/2016: Left ventricle: The cavity size was normal. Systolic function was  normal. The estimated ejection fraction was in the range of 55%  to 60%. Wall motion was normal; there were no regional wall  motion abnormalities. Doppler parameters are consistent with  abnormal left ventricular relaxation (grade 1 diastolic  dysfunction). - Left atrium: The atrium was normal in size. - Right ventricle: Systolic function was normal. - Pulmonary arteries: Systolic pressure was severely elevated. PA  peak pressure: 77 mm Hg (S).  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   ASSESSMENT AND PLAN:  Cardiac murmur, systolic May be related to TR, and pulmonary hypertension. Pulmonary hypertension is likely related to COPD/emphysema.  Hypertension Recommend blood pressure log. Agree with antihypertensive medication. Patient has a follow-up with PCP in a few weeks for monitoring of hypertension. Recommendation: May increase HCTZ, or try amlodipine, or try ARB. Patient reports that she was previously on benicar.   Pulmonary hypertension Likely in the setting of emphysema No evidence of CHF on exam.   Current medicines are reviewed at length with the patient today.  The patient does not have concerns regarding medicines.  Labs/ tests ordered today include:  No orders of the defined types were placed in this encounter.    I had a lengthy and detailed discussion with the patient regarding diagnoses, prognosis, diagnostic options, treatment  options.  I counseled the patient on importance of lifestyle modification including heart healthy diet, regular physical activity.  Disposition:   FU with undersigned in 6 months   Signed, Wende Bushy, MD  03/23/2016 3:32 PM    [facility]

## 2016-03-23 NOTE — Patient Instructions (Signed)
Medication Instructions:  Your physician recommends that you continue on your current medications as directed. Please refer to the Current Medication list given to you today.   Labwork: None ordered  Testing/Procedures: None ordered  Follow-Up: Your physician wants you to follow-up in: 6 months with Dr. Ingal. You will receive a reminder letter in the mail two months in advance. If you don't receive a letter, please call our office to schedule the follow-up appointment.   Any Other Special Instructions Will Be Listed Below (If Applicable).     If you need a refill on your cardiac medications before your next appointment, please call your pharmacy.   

## 2016-03-31 ENCOUNTER — Ambulatory Visit: Payer: Medicare Other | Admitting: Family Medicine

## 2016-04-03 ENCOUNTER — Encounter: Payer: Self-pay | Admitting: Family Medicine

## 2016-04-03 ENCOUNTER — Ambulatory Visit (INDEPENDENT_AMBULATORY_CARE_PROVIDER_SITE_OTHER): Payer: Medicare Other | Admitting: Family Medicine

## 2016-04-03 VITALS — BP 160/82 | HR 91 | Temp 98.4°F | Resp 16 | Ht 64.0 in | Wt 146.0 lb

## 2016-04-03 DIAGNOSIS — J449 Chronic obstructive pulmonary disease, unspecified: Secondary | ICD-10-CM

## 2016-04-03 DIAGNOSIS — I10 Essential (primary) hypertension: Secondary | ICD-10-CM

## 2016-04-03 NOTE — Assessment & Plan Note (Signed)
Start medication today. Encouraged pt to monitor BP daily. Recheck in 1 mos for BP control. Consider adding amlodipine or ACE/ARB for further control.  Try HCTZ first since patient has tolerated in the past.

## 2016-04-03 NOTE — Patient Instructions (Signed)
Start taking blood pressure medication once daily. This is waiting at [facility] for you.  Check your blood pressure once daily: Goal is >100/60 but less than 150/90  Work on low salt/sodium diet - goal <1.5gm (1,500mg ) per day. Eat a diet high in fruits/vegetables and whole grains.  Look into mediterranean and DASH diet. Goal activity is 122min/wk of moderate intensity exercise.  This can be split into 30 minute chunks.  If you are not at this level, you can start with smaller 10-15 min increments and slowly build up activity. Look at [email or web address] for more resources  Please seek immediate medical attention at ER or Urgent Care if you develop: Chest pain, pressure or tightness. Shortness of breath accompanied by nausea or diaphoresis Visual changes Numbness or tingling on one side of the body Facial droop Altered mental status Or any concerning symptoms.

## 2016-04-03 NOTE — Assessment & Plan Note (Signed)
Stable- no breathing issues at this time. Remains on home O2.

## 2016-04-03 NOTE — Progress Notes (Signed)
Subjective:    Patient ID: Laura Nicholson, female    DOB: June 22, 1932, 82 y.o.   MRN: [id number]  HPI: Laura Nicholson is a 82 y.o. female presenting on 04/03/2016 for Hypertension   HPI  Pt presents for follow-up of hypertension. She was placed on HCTZ 12.5mg  once daily at last visit but did not pick it up from her pharmacy. No chest pain. Baseline shortness of breath. No checking BP at home- has meter. Recently followed with cardiology for heart murmur.  COPD is stable- follows with Dr. Humphrey Rolls. On home O2.     Past Medical History  Diagnosis Date  . Allergy   . Arthritis   . Asthma   . Depression   . COPD (chronic obstructive pulmonary disease) ([place])   . Glaucoma   . Chronic kidney disease     in past  . Hypertension   . Cancer ([place])     in past, 1989 Left Lumpectomy    Current Outpatient Prescriptions on File Prior to Visit  Medication Sig  . albuterol (PROVENTIL HFA;VENTOLIN HFA) 108 (90 BASE) MCG/ACT inhaler Inhale 2 puffs into the lungs 4 (four) times daily as needed.  Marland Kitchen aspirin 325 MG tablet Take 325 mg by mouth every other day.  . budesonide-formoterol (SYMBICORT) 80-4.5 MCG/ACT inhaler Inhale 1 puff into the lungs 2 (two) times daily.  . clorazepate (TRANXENE) 3.75 MG tablet Take 1 tablet by mouth 2 (two) times daily. Pt takes it as needed  . hydrochlorothiazide (HYDRODIURIL) 12.5 MG tablet Take 1 tablet (12.5 mg total) by mouth daily.  Marland Kitchen ibuprofen (ADVIL,MOTRIN) 800 MG tablet Take 1 tablet by mouth 2 (two) times daily as needed.  Marland Kitchen ipratropium-albuterol (DUONEB) 0.5-2.5 (3) MG/3ML SOLN Inhale 3 mLs into the lungs QID.  Marland Kitchen latanoprost (XALATAN) 0.005 % ophthalmic solution Place 1 drop into both eyes at bedtime.   No current facility-administered medications on file prior to visit.    Review of Systems  Constitutional: Negative for fever and chills.  HENT: Negative.   Respiratory: Positive for shortness of breath (baseline- severe COPD on O2). Negative for cough,  chest tightness and wheezing.   Cardiovascular: Negative for chest pain and leg swelling.  Gastrointestinal: Negative for nausea, vomiting, abdominal pain, diarrhea and constipation.  Endocrine: Negative.  Negative for cold intolerance, heat intolerance, polydipsia, polyphagia and polyuria.  Genitourinary: Negative for dysuria and difficulty urinating.  Musculoskeletal: Negative.   Neurological: Negative for dizziness, light-headedness and numbness.  Psychiatric/Behavioral: Negative.    Per HPI unless specifically indicated above     Objective:    BP 160/82 mmHg  Pulse 91  Temp(Src) 98.4 F (36.9 C) (Oral)  Resp 16  Ht 5\' 4"  (1.626 m)  Wt 146 lb (66.225 kg)  BMI 25.05 kg/m2  SpO2 92%  Wt Readings from Last 3 Encounters:  04/03/16 146 lb (66.225 kg)  03/23/16 145 lb (65.772 kg)  03/03/16 145 lb (65.772 kg)    Physical Exam  Constitutional: She is oriented to person, place, and time. She appears well-developed and well-nourished.  HENT:  Head: Normocephalic and atraumatic.  Neck: Neck supple.  Cardiovascular: Normal rate, regular rhythm and normal pulses.  Exam reveals no gallop and no friction rub.   Murmur heard.  Systolic murmur is present with a grade of 2/6  Heard best at LSB.  Pulmonary/Chest: Effort normal and breath sounds normal. She has no wheezes. She exhibits no tenderness.  Abdominal: Soft. Normal appearance and bowel sounds are normal. She exhibits no distension and  no mass. There is no tenderness. There is no rebound and no guarding.  Musculoskeletal: Normal range of motion. She exhibits no edema or tenderness.       Right lower leg: Normal.       Left lower leg: Normal.  +2 pedal edema.   Lymphadenopathy:    She has no cervical adenopathy.  Neurological: She is alert and oriented to person, place, and time.  Skin: Skin is warm and dry.   Results for orders placed or performed in visit on 0000000  Basic Metabolic Panel (BMET)  Result Value Ref Range    Glucose 95 65 - 99 mg/dL   BUN 16 8 - 27 mg/dL   Creatinine, Ser 0.90 0.57 - 1.00 mg/dL   GFR calc non Af Amer 60 >59 mL/min/1.73   GFR calc Af Amer 69 >59 mL/min/1.73   BUN/Creatinine Ratio 18 11 - 26   Sodium 147 (H) 134 - 144 mmol/L   Potassium 4.5 3.5 - 5.2 mmol/L   Chloride 108 (H) 96 - 106 mmol/L   CO2 19 18 - 29 mmol/L   Calcium 9.1 8.7 - 10.3 mg/dL      Assessment & Plan:   Problem List Items Addressed This Visit      Cardiovascular and Mediastinum   Essential (primary) hypertension - Primary    Start medication today. Encouraged pt to monitor BP daily. Recheck in 1 mos for BP control. Consider adding amlodipine or ACE/ARB for further control.  Try HCTZ first since patient has tolerated in the past.         Respiratory   Severe chronic obstructive pulmonary disease (HCC)    Stable- no breathing issues at this time. Remains on home O2.         No orders of the defined types were placed in this encounter.      Follow up plan: Return in about 1 month (around 05/04/2016) for Bp check. Marland Kitchen

## 2016-04-19 ENCOUNTER — Telehealth: Payer: Self-pay | Admitting: Family Medicine

## 2016-04-19 NOTE — Telephone Encounter (Signed)
Pt called Wanted to know when her BP go down do she  Need to take her BP medication  ( BP 90/40, 99/48. Pt call back  # is [phone]

## 2016-04-20 NOTE — Telephone Encounter (Signed)
As per pt it was only yesterday low number I advised per Amy and suggested to keep log for blood pressure.

## 2016-04-20 NOTE — Telephone Encounter (Signed)
Please advise the patient to hold her BP medication if blood pressure is < 100/60. Is her blood pressure often that low? Thanks! AK

## 2016-05-08 ENCOUNTER — Ambulatory Visit (INDEPENDENT_AMBULATORY_CARE_PROVIDER_SITE_OTHER): Payer: Medicare Other | Admitting: Family Medicine

## 2016-05-08 VITALS — BP 115/67 | HR 93 | Temp 98.6°F | Resp 16 | Ht 64.0 in | Wt 142.0 lb

## 2016-05-08 DIAGNOSIS — J449 Chronic obstructive pulmonary disease, unspecified: Secondary | ICD-10-CM

## 2016-05-08 DIAGNOSIS — R7309 Other abnormal glucose: Secondary | ICD-10-CM | POA: Diagnosis not present

## 2016-05-08 DIAGNOSIS — I10 Essential (primary) hypertension: Secondary | ICD-10-CM | POA: Diagnosis not present

## 2016-05-08 LAB — BASIC METABOLIC PANEL WITH GFR
BUN: 18 mg/dL (ref 7–25)
CALCIUM: 9.1 mg/dL (ref 8.6–10.4)
CO2: 27 mmol/L (ref 20–31)
Chloride: 106 mmol/L (ref 98–110)
Creat: 0.92 mg/dL — ABNORMAL HIGH (ref 0.60–0.88)
GFR, EST AFRICAN AMERICAN: 67 mL/min (ref >=60)
GFR, EST NON AFRICAN AMERICAN: 58 mL/min — AB (ref >=60)
Glucose, Bld: 93 mg/dL (ref 65–99)
Potassium: 4.3 mmol/L (ref 3.5–5.3)
SODIUM: 143 mmol/L (ref 135–146)

## 2016-05-08 LAB — CBC WITH DIFFERENTIAL/PLATELET
BASOS PCT: 0 %
Basophils Absolute: 0 cells/uL (ref 0–200)
EOS PCT: 2 %
Eosinophils Absolute: 138 cells/uL (ref 15–500)
HCT: 44.6 % (ref 35.0–45.0)
HEMOGLOBIN: 13.9 g/dL (ref 11.7–15.5)
LYMPHS ABS: 1932 {cells}/uL (ref 850–3900)
Lymphocytes Relative: 28 %
MCH: 25.5 pg — ABNORMAL LOW (ref 27.0–33.0)
MCHC: 31.2 g/dL — AB (ref 32.0–36.0)
MCV: 81.7 fL (ref 80.0–100.0)
Monocytes Absolute: 621 cells/uL (ref 200–950)
Monocytes Relative: 9 %
NEUTROS ABS: 4209 {cells}/uL (ref 1500–7800)
NEUTROS PCT: 61 %
Platelets: 135 10*3/uL — ABNORMAL LOW (ref 140–400)
RBC: 5.46 MIL/uL — AB (ref 3.80–5.10)
RDW: 14.9 % (ref 11.0–15.0)
WBC: 6.9 10*3/uL (ref 3.8–10.8)

## 2016-05-08 LAB — HEMOGLOBIN A1C
Hgb A1c MFr Bld: 6.4 % — ABNORMAL HIGH (ref <5.7)
MEAN PLASMA GLUCOSE: 137 mg/dL

## 2016-05-08 MED ORDER — CHOLECALCIFEROL 10 MCG (400 UNIT) PO CAPS: 2.0000 | ORAL_CAPSULE | Freq: Every day | ORAL | Status: AC

## 2016-05-08 NOTE — Assessment & Plan Note (Signed)
Check CBC due to chronic hypoxia.

## 2016-05-08 NOTE — Assessment & Plan Note (Addendum)
BP controlled. Hold if <100/60 prior to taking medication. Check BP prior to taking medication. Recheck 3 mos. Call with persistent hypotension.

## 2016-05-08 NOTE — Progress Notes (Signed)
Subjective:    Patient ID: Laura Nicholson, female    DOB: Mar 12, 1932, 82 y.o.   MRN: [id number]  HPI: Laura Nicholson is a 82 y.o. female presenting on 05/08/2016 for Hypertension   HPI  Pt presents for blood pressure check. Her blood pressure is well controlled today. Checking blood pressure at home- avg 110-120/70 at home. Just taking HCTZ 12.5mg  once daily at home. No dizziness or chest pain.   Past Medical History  Diagnosis Date  . Allergy   . Arthritis   . Asthma   . Depression   . COPD (chronic obstructive pulmonary disease) ([place])   . Glaucoma   . Chronic kidney disease     in past  . Hypertension   . Cancer ([place])     in past, 1989 Left Lumpectomy    Current Outpatient Prescriptions on File Prior to Visit  Medication Sig  . albuterol (PROVENTIL HFA;VENTOLIN HFA) 108 (90 BASE) MCG/ACT inhaler Inhale 2 puffs into the lungs 4 (four) times daily as needed.  Marland Kitchen aspirin 325 MG tablet Take 325 mg by mouth every other day.  . budesonide-formoterol (SYMBICORT) 80-4.5 MCG/ACT inhaler Inhale 1 puff into the lungs 2 (two) times daily.  . clorazepate (TRANXENE) 3.75 MG tablet Take 1 tablet by mouth 2 (two) times daily. Pt takes it as needed  . hydrochlorothiazide (HYDRODIURIL) 12.5 MG tablet Take 1 tablet (12.5 mg total) by mouth daily.  Marland Kitchen ibuprofen (ADVIL,MOTRIN) 800 MG tablet Take 1 tablet by mouth 2 (two) times daily as needed.  Marland Kitchen ipratropium-albuterol (DUONEB) 0.5-2.5 (3) MG/3ML SOLN Inhale 3 mLs into the lungs QID.  Marland Kitchen latanoprost (XALATAN) 0.005 % ophthalmic solution Place 1 drop into both eyes at bedtime.   No current facility-administered medications on file prior to visit.    Review of Systems  Constitutional: Negative for fever and chills.  HENT: Negative.   Respiratory: Negative for cough, chest tightness and wheezing.   Cardiovascular: Negative for chest pain and leg swelling.  Gastrointestinal: Negative for nausea, vomiting, abdominal pain, diarrhea and  constipation.  Endocrine: Negative.  Negative for cold intolerance, heat intolerance, polydipsia, polyphagia and polyuria.  Genitourinary: Negative for dysuria and difficulty urinating.  Musculoskeletal: Negative.   Neurological: Negative for dizziness, light-headedness and numbness.  Psychiatric/Behavioral: Negative.    Per HPI unless specifically indicated above     Objective:    BP 115/67 mmHg  Pulse 93  Temp(Src) 98.6 F (37 C) (Oral)  Resp 16  Ht 5\' 4"  (1.626 m)  Wt 142 lb (64.411 kg)  BMI 24.36 kg/m2  SpO2 93%  Wt Readings from Last 3 Encounters:  05/08/16 142 lb (64.411 kg)  04/03/16 146 lb (66.225 kg)  03/23/16 145 lb (65.772 kg)    Physical Exam  Constitutional: She is oriented to person, place, and time. She appears well-developed and well-nourished.  HENT:  Head: Normocephalic and atraumatic.  Neck: Neck supple.  Cardiovascular: Normal rate, regular rhythm and normal heart sounds.  Exam reveals no gallop and no friction rub.   No murmur heard. Pulmonary/Chest: Effort normal and breath sounds normal. She has no wheezes. She exhibits no tenderness.  Abdominal: Soft. Normal appearance and bowel sounds are normal. She exhibits no distension and no mass. There is no tenderness. There is no rebound and no guarding.  Musculoskeletal: Normal range of motion. She exhibits no edema or tenderness.  Trace pedal edema bilateral feet.   Lymphadenopathy:    She has no cervical adenopathy.  Neurological: She is alert and oriented  to person, place, and time.  Skin: Skin is warm and dry.   Results for orders placed or performed in visit on 0000000  Basic Metabolic Panel (BMET)  Result Value Ref Range   Glucose 95 65 - 99 mg/dL   BUN 16 8 - 27 mg/dL   Creatinine, Ser 0.90 0.57 - 1.00 mg/dL   GFR calc non Af Amer 60 >59 mL/min/1.73   GFR calc Af Amer 69 >59 mL/min/1.73   BUN/Creatinine Ratio 18 11 - 26   Sodium 147 (H) 134 - 144 mmol/L   Potassium 4.5 3.5 - 5.2 mmol/L    Chloride 108 (H) 96 - 106 mmol/L   CO2 19 18 - 29 mmol/L   Calcium 9.1 8.7 - 10.3 mg/dL      Assessment & Plan:   Problem List Items Addressed This Visit      Cardiovascular and Mediastinum   Essential (primary) hypertension - Primary    BP controlled. Hold if <100/60 prior to taking medication. Check BP prior to taking medication. Recheck 3 mos. Call with persistent hypotension.       Relevant Orders   BASIC METABOLIC PANEL WITH GFR     Respiratory   COPD, severe (HCC)    Check CBC due to chronic hypoxia.       Relevant Orders   CBC with Differential/Platelet    Other Visit Diagnoses    Elevated glucose        Check hemoglobin A1c.     Relevant Orders    Hemoglobin A1c       Meds ordered this encounter  Medications  . Cholecalciferol 400 units CAPS    Sig: Take 2 capsules (800 Units total) by mouth daily.    Dispense:  30 each    Refill:  0    Order Specific Question:  Supervising Provider    Answer:  Arlis Porta [id number]      Follow up plan: Return in about 3 months (around 08/08/2016) for BP.

## 2016-05-08 NOTE — Patient Instructions (Addendum)
Stop blood pressure medication if your BP is < 100/60 or bottom number is <50 with any top number reading, don't take medication that day.   Goal: 20 minutes of weight bearing exercise (walking counts). 1200mg  of dietary calcium and 800IU vitamin D daily.

## 2016-05-09 ENCOUNTER — Telehealth: Payer: Self-pay | Admitting: Family Medicine

## 2016-05-09 MED ORDER — METFORMIN HCL 500 MG PO TABS: 500.0000 mg | ORAL_TABLET | Freq: Every day | ORAL | Status: DC

## 2016-05-09 NOTE — Telephone Encounter (Signed)
Sent to pharmacy on file. Patient made aware.

## 2016-05-09 NOTE — Telephone Encounter (Signed)
-----   Message from Jeanelle Malling, [place] sent at 05/09/2016  3:48 PM EDT ----- Advised pt she will cut down her sugar in diet but she also would like to try metformin if Jarome Trull think it's beneficiary.

## 2016-06-20 DIAGNOSIS — H25812 Combined forms of age-related cataract, left eye: Secondary | ICD-10-CM | POA: Diagnosis not present

## 2016-06-20 DIAGNOSIS — Z961 Presence of intraocular lens: Secondary | ICD-10-CM | POA: Diagnosis not present

## 2016-06-20 DIAGNOSIS — H43811 Vitreous degeneration, right eye: Secondary | ICD-10-CM | POA: Diagnosis not present

## 2016-06-20 DIAGNOSIS — H401131 Primary open-angle glaucoma, bilateral, mild stage: Secondary | ICD-10-CM | POA: Diagnosis not present

## 2016-06-27 DIAGNOSIS — J9611 Chronic respiratory failure with hypoxia: Secondary | ICD-10-CM | POA: Diagnosis not present

## 2016-06-27 DIAGNOSIS — J449 Chronic obstructive pulmonary disease, unspecified: Secondary | ICD-10-CM | POA: Diagnosis not present

## 2016-06-27 DIAGNOSIS — R0602 Shortness of breath: Secondary | ICD-10-CM | POA: Diagnosis not present

## 2016-08-15 ENCOUNTER — Ambulatory Visit: Payer: Medicare Other | Admitting: Family Medicine

## 2016-08-16 ENCOUNTER — Encounter: Payer: Self-pay | Admitting: Family Medicine

## 2016-08-16 ENCOUNTER — Ambulatory Visit: Payer: Medicare Other

## 2016-08-16 ENCOUNTER — Ambulatory Visit (INDEPENDENT_AMBULATORY_CARE_PROVIDER_SITE_OTHER): Payer: Medicare Other | Admitting: Family Medicine

## 2016-08-16 VITALS — BP 139/60 | HR 95 | Temp 98.3°F | Resp 16 | Ht 64.0 in | Wt 140.0 lb

## 2016-08-16 DIAGNOSIS — I10 Essential (primary) hypertension: Secondary | ICD-10-CM

## 2016-08-16 DIAGNOSIS — R630 Anorexia: Secondary | ICD-10-CM

## 2016-08-16 DIAGNOSIS — F325 Major depressive disorder, single episode, in full remission: Secondary | ICD-10-CM | POA: Diagnosis not present

## 2016-08-16 DIAGNOSIS — R634 Abnormal weight loss: Secondary | ICD-10-CM

## 2016-08-16 MED ORDER — LOSARTAN POTASSIUM 25 MG PO TABS: 25.0000 mg | ORAL_TABLET | Freq: Every day | ORAL | 11 refills | Status: AC

## 2016-08-16 MED ORDER — CLORAZEPATE DIPOTASSIUM 3.75 MG PO TABS: 3.7500 mg | ORAL_TABLET | Freq: Two times a day (BID) | ORAL | 1 refills | Status: AC

## 2016-08-16 NOTE — Assessment & Plan Note (Signed)
Renewed PRN Tranxene. Last fill was July of 2016. Pt uses sparingly and PRN. Possible depression as source of poor appetite- however will work up for organic causes.

## 2016-08-16 NOTE — Progress Notes (Signed)
Subjective:    Patient ID: Laura Nicholson, female    DOB: 11-15-1932, 82 y.o.   MRN: [id number]  HPI: Laura Nicholson is a 82 y.o. female presenting on 08/16/2016 for Hypertension (pt is feeling weak today)   HPI  Pt presents for follow-up for blood pressure. Is feeling a little run down today. No dizziness. No chest pain. No pre-syncope. Overall blood pressure is doing well. Feeling run down just today. Did not sleep well last night.  Concerned about 6lb weight loss. Lost her appetite. Appetite has been gone since May. Has been drinking ensure- yogurt and bananas.  Saw Dr. Humphrey Rolls last month for her breathing. Is worried it could be her blood pressure medication since it started when she started the HCTZ. However she does not take this medication daily. She has not taken in over 1 mos.   Past Medical History:  Diagnosis Date  . Allergy   . Arthritis   . Asthma   . Cancer ([place])    in past, 1989 Left Lumpectomy  . Chronic kidney disease    in past  . COPD (chronic obstructive pulmonary disease) ([place])   . Depression   . Glaucoma   . Hypertension     Current Outpatient Prescriptions on File Prior to Visit  Medication Sig  . albuterol (PROVENTIL HFA;VENTOLIN HFA) 108 (90 BASE) MCG/ACT inhaler Inhale 2 puffs into the lungs 4 (four) times daily as needed.  Marland Kitchen aspirin 325 MG tablet Take 325 mg by mouth every other day.  . budesonide-formoterol (SYMBICORT) 80-4.5 MCG/ACT inhaler Inhale 1 puff into the lungs 2 (two) times daily.  . Cholecalciferol 400 units CAPS Take 2 capsules (800 Units total) by mouth daily.  Marland Kitchen ibuprofen (ADVIL,MOTRIN) 800 MG tablet Take 1 tablet by mouth 2 (two) times daily as needed.  Marland Kitchen ipratropium-albuterol (DUONEB) 0.5-2.5 (3) MG/3ML SOLN Inhale 3 mLs into the lungs QID.  Marland Kitchen latanoprost (XALATAN) 0.005 % ophthalmic solution Place 1 drop into both eyes at bedtime.  . metFORMIN (GLUCOPHAGE) 500 MG tablet Take 1 tablet (500 mg total) by mouth daily with breakfast.   No  current facility-administered medications on file prior to visit.     Review of Systems  Constitutional: Positive for appetite change, fatigue and unexpected weight change. Negative for chills and fever.  HENT: Negative.   Respiratory: Negative for cough, chest tightness and wheezing.   Cardiovascular: Negative for chest pain and leg swelling.  Gastrointestinal: Negative for abdominal pain, constipation, diarrhea, nausea and vomiting.  Endocrine: Negative.  Negative for cold intolerance, heat intolerance, polydipsia, polyphagia and polyuria.  Genitourinary: Negative for difficulty urinating and dysuria.  Musculoskeletal: Negative.   Neurological: Negative for dizziness, light-headedness and numbness.  Psychiatric/Behavioral: Negative.    Per HPI unless specifically indicated above     Objective:    BP 139/60 (BP Location: Right Arm, Patient Position: Sitting, Cuff Size: Normal)   Pulse 95   Temp 98.3 F (36.8 C) (Oral)   Resp 16   Ht _0  (1.626 m)   Wt 140 lb (63.5 kg)   SpO2 98%   BMI 24.03 kg/m   Wt Readings from Last 3 Encounters:  08/16/16 140 lb (63.5 kg)  05/08/16 142 lb (64.4 kg)  04/03/16 146 lb (66.2 kg)    Physical Exam  Constitutional: She is oriented to person, place, and time. She appears well-developed and well-nourished.  HENT:  Head: Normocephalic and atraumatic.  Neck: Neck supple.  Cardiovascular: Normal rate and regular rhythm.  Exam reveals  no gallop and no friction rub.   Murmur (2/6 systolic murmur) heard. Pulmonary/Chest: Effort normal. She has decreased breath sounds in the right lower field and the left lower field. She has no wheezes. She exhibits no tenderness.  On 2L [place]  Abdominal: Soft. Normal appearance and bowel sounds are normal. She exhibits no distension and no mass. There is no tenderness. There is no rebound and no guarding.  Musculoskeletal: Normal range of motion. She exhibits no edema or tenderness.  Lymphadenopathy:    She has  no cervical adenopathy.  Neurological: She is alert and oriented to person, place, and time.  Skin: Skin is warm and dry.   Results for orders placed or performed in visit on 05/08/16  Hemoglobin A1c  Result Value Ref Range   Hgb A1c MFr Bld 6.4 (H) <5.7 %   Mean Plasma Glucose 137 mg/dL  CBC with Differential/Platelet  Result Value Ref Range   WBC 6.9 3.8 - 10.8 K/uL   RBC 5.46 (H) 3.80 - 5.10 MIL/uL   Hemoglobin 13.9 11.7 - 15.5 g/dL   HCT 44.6 35.0 - 45.0 %   MCV 81.7 80.0 - 100.0 fL   MCH 25.5 (L) 27.0 - 33.0 pg   MCHC 31.2 (L) 32.0 - 36.0 g/dL   RDW 14.9 11.0 - 15.0 %   Platelets 135 (L) 140 - 400 K/uL   MPV Not Performed 7.5 - 12.5 fL   Neutro Abs 4,209 1,500 - 7,800 cells/uL   Lymphs Abs 1,932 850 - 3,900 cells/uL   Monocytes Absolute 621 200 - 950 cells/uL   Eosinophils Absolute 138 15 - 500 cells/uL   Basophils Absolute 0 0 - 200 cells/uL   Neutrophils Relative % 61 %   Lymphocytes Relative 28 %   Monocytes Relative 9 %   Eosinophils Relative 2 %   Basophils Relative 0 %   Smear Review Criteria for review not met   BASIC METABOLIC PANEL WITH GFR  Result Value Ref Range   Sodium 143 135 - 146 mmol/L   Potassium 4.3 3.5 - 5.3 mmol/L   Chloride 106 98 - 110 mmol/L   CO2 27 20 - 31 mmol/L   Glucose, Bld 93 65 - 99 mg/dL   BUN 18 7 - 25 mg/dL   Creat 0.92 (H) 0.60 - 0.88 mg/dL   Calcium 9.1 8.6 - 10.4 mg/dL   GFR, Est African American 67 >=60 mL/min   GFR, Est Non African American 58 (L) >=60 mL/min      Assessment & Plan:   Problem List Items Addressed This Visit      Cardiovascular and Mediastinum   Essential (primary) hypertension    Controlled today. However will change to losartan 83m once daily since pt is experiencing anorexia with HCTZ.  Encouraged pt to take daily. Recheck 4 weeks to determine efficacy of losartan.       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 Last fill was July of 2016. Pt uses sparingly and PRN. Possible depression as source of poor appetite- however will work up for organic causes.       Relevant Medications   clorazepate (TRANXENE) 3.75 MG tablet    Other Visit Diagnoses    Weight loss    -  Primary   6lbs past 3 mos. Pt is concerned. Likely 2/2 poor appetite. Full lab  work up is pending.    Relevant Orders   CBC with Differential   COMPLETE METABOLIC PANEL WITH GFR   Poor appetite       Check CBC, CMP. Possibily related to patients severe COPD. Will request records from Dr. Humphrey Rolls. Recommend ensure daily. Change BP medication. Recheck 4 weeks.       Meds ordered this encounter  Medications  . clorazepate (TRANXENE) 3.75 MG tablet    Sig: Take 1 tablet (3.75 mg total) by mouth 2 (two) times daily. Pt takes it as needed    Dispense:  60 tablet    Refill:  1    Order Specific Question:   Supervising Provider    Answer:   Arlis Porta [[id number]]  . losartan (COZAAR) 25 MG tablet    Sig: Take 1 tablet (25 mg total) by mouth daily.    Dispense:  30 tablet    Refill:  11    Order Specific Question:   Supervising Provider    Answer:   Arlis Porta [phone]      Follow up plan: Return in about 4 weeks (around 09/13/2016), or if symptoms worsen or fail to improve, for Blood pressure and appetite. Marland Kitchen

## 2016-08-16 NOTE — Assessment & Plan Note (Signed)
Controlled today. However will change to losartan 25mg  once daily since pt is experiencing anorexia with HCTZ.  Encouraged pt to take daily. Recheck 4 weeks to determine efficacy of losartan.

## 2016-08-16 NOTE — Patient Instructions (Signed)
Blood pressure medication- We will change to Losartan to see if that helps with your appetite.  Poor appetite: We will check some lab work to determine if anything has changed that could be causing your symptoms.  Continue to add ensure and soup as you have been doing at home. This will help you maintain adequate nutrition.  To the ER for severe abdominal pain, nausea, vomiting, or other concerning symptoms.

## 2016-08-17 LAB — COMPLETE METABOLIC PANEL WITH GFR
ALT: 14 U/L (ref 6–29)
AST: 20 U/L (ref 10–35)
Albumin: 3.9 g/dL (ref 3.6–5.1)
Alkaline Phosphatase: 64 U/L (ref 33–130)
BUN: 20 mg/dL (ref 7–25)
CHLORIDE: 107 mmol/L (ref 98–110)
CO2: 26 mmol/L (ref 20–31)
Calcium: 9.3 mg/dL (ref 8.6–10.4)
Creat: 0.93 mg/dL — ABNORMAL HIGH (ref 0.60–0.88)
GFR, EST NON AFRICAN AMERICAN: 57 mL/min — AB (ref >=60)
GFR, Est African American: 66 mL/min (ref >=60)
GLUCOSE: 112 mg/dL — AB (ref 65–99)
POTASSIUM: 4.2 mmol/L (ref 3.5–5.3)
SODIUM: 142 mmol/L (ref 135–146)
Total Bilirubin: 0.6 mg/dL (ref 0.2–1.2)
Total Protein: 6.8 g/dL (ref 6.1–8.1)

## 2016-08-17 LAB — CBC WITH DIFFERENTIAL/PLATELET
BASOS ABS: 0 {cells}/uL (ref 0–200)
BASOS PCT: 0 %
EOS PCT: 1 %
Eosinophils Absolute: 69 cells/uL (ref 15–500)
HCT: 42.7 % (ref 35.0–45.0)
HEMOGLOBIN: 13.4 g/dL (ref 11.7–15.5)
LYMPHS ABS: 1794 {cells}/uL (ref 850–3900)
Lymphocytes Relative: 26 %
MCH: 25.8 pg — AB (ref 27.0–33.0)
MCHC: 31.4 g/dL — AB (ref 32.0–36.0)
MCV: 82.1 fL (ref 80.0–100.0)
MONOS PCT: 8 %
Monocytes Absolute: 552 cells/uL (ref 200–950)
NEUTROS ABS: 4485 {cells}/uL (ref 1500–7800)
NEUTROS PCT: 65 %
PLATELETS: 119 10*3/uL — AB (ref 140–400)
RBC: 5.2 MIL/uL — ABNORMAL HIGH (ref 3.80–5.10)
RDW: 15.5 % — ABNORMAL HIGH (ref 11.0–15.0)
WBC: 6.9 10*3/uL (ref 3.8–10.8)

## 2016-09-14 ENCOUNTER — Ambulatory Visit (INDEPENDENT_AMBULATORY_CARE_PROVIDER_SITE_OTHER): Payer: Medicare Other | Admitting: Family Medicine

## 2016-09-14 ENCOUNTER — Encounter: Payer: Self-pay | Admitting: Family Medicine

## 2016-09-14 VITALS — BP 138/62 | HR 93 | Temp 98.4°F | Resp 16 | Ht 64.0 in | Wt 137.0 lb

## 2016-09-14 DIAGNOSIS — D696 Thrombocytopenia, unspecified: Secondary | ICD-10-CM | POA: Diagnosis not present

## 2016-09-14 DIAGNOSIS — R7303 Prediabetes: Secondary | ICD-10-CM

## 2016-09-14 DIAGNOSIS — R634 Abnormal weight loss: Secondary | ICD-10-CM | POA: Diagnosis not present

## 2016-09-14 DIAGNOSIS — I10 Essential (primary) hypertension: Secondary | ICD-10-CM | POA: Diagnosis not present

## 2016-09-14 LAB — CBC WITH DIFFERENTIAL/PLATELET
BASOS ABS: 0 {cells}/uL (ref 0–200)
BASOS PCT: 0 %
EOS PCT: 2 %
Eosinophils Absolute: 158 cells/uL (ref 15–500)
HCT: 42.5 % (ref 35.0–45.0)
Hemoglobin: 13.6 g/dL (ref 11.7–15.5)
Lymphocytes Relative: 21 %
Lymphs Abs: 1659 cells/uL (ref 850–3900)
MCH: 26 pg — AB (ref 27.0–33.0)
MCHC: 32 g/dL (ref 32.0–36.0)
MCV: 81.3 fL (ref 80.0–100.0)
MONOS PCT: 10 %
Monocytes Absolute: 790 cells/uL (ref 200–950)
NEUTROS ABS: 5293 {cells}/uL (ref 1500–7800)
NEUTROS PCT: 67 %
PLATELETS: 170 10*3/uL (ref 140–400)
RBC: 5.23 MIL/uL — ABNORMAL HIGH (ref 3.80–5.10)
RDW: 15.4 % — ABNORMAL HIGH (ref 11.0–15.0)
WBC: 7.9 10*3/uL (ref 3.8–10.8)

## 2016-09-14 LAB — TSH: TSH: 1.33 m[IU]/L

## 2016-09-14 LAB — POCT GLYCOSYLATED HEMOGLOBIN (HGB A1C): HEMOGLOBIN A1C: 6.1

## 2016-09-14 NOTE — Assessment & Plan Note (Signed)
Recheck platelet count today. Appeared adequate last check. Plan for hematology consult if less than 120.

## 2016-09-14 NOTE — Patient Instructions (Signed)
Overall your blood pressure is doing well with the change to Losartan. We will stop your Metformin to determine if that was the source of the weight loss.  Continue to aim for 2 meals per day with an ensure or 3 meals per day. Eat well rounded meals.   Your goal blood pressure is 150/90. Work on low salt/sodium diet - goal <1.5gm (1,500mg ) per day. Eat a diet high in fruits/vegetables and whole grains.  Look into mediterranean and DASH diet. Goal activity is 164min/wk of moderate intensity exercise.  This can be split into 30 minute chunks.  If you are not at this level, you can start with smaller 10-15 min increments and slowly build up activity. Look at [place] for more resources

## 2016-09-14 NOTE — Assessment & Plan Note (Signed)
Controlled on recheck. Continue Losartan. Encouraged pt to take daily or to monitor BP closely. Pt is tolerating side effects much better with this medication. Check BMP for renal function today.

## 2016-09-14 NOTE — Progress Notes (Signed)
Subjective:    Patient ID: Laura Nicholson, female    DOB: 1932-05-26, 82 y.o.   MRN: [id number]  HPI: Evelen Janiak is a 82 y.o. female presenting on 09/14/2016 for Hypertension   HPI  Pt presents for follow-up of hypertension. Medications were switched due to lack of appetite. She reports her appetite has improved she has lost 3 more lbs since last visit- total loss of 5lbs since June- at that time she was started on Metformin. Eats 2-3 meals per day. Eats eggs and oatmeal for breakfast. Eats 2 veggies and meat for dinner. No blood in stools. No diarrhea. Nausea or vomiting. Pt is taking metformin daily to help with pre-diabetes.  Pt does not take BP medication every day. Did not take blood pressure medication this morning.   Past Medical History:  Diagnosis Date  . Allergy   . Arthritis   . Asthma   . Cancer ([place])    in past, 1989 Left Lumpectomy  . Chronic kidney disease    in past  . COPD (chronic obstructive pulmonary disease) ([place])   . Depression   . Glaucoma   . Hypertension     Current Outpatient Prescriptions on File Prior to Visit  Medication Sig  . albuterol (PROVENTIL HFA;VENTOLIN HFA) 108 (90 BASE) MCG/ACT inhaler Inhale 2 puffs into the lungs 4 (four) times daily as needed.  Marland Kitchen aspirin 325 MG tablet Take 325 mg by mouth every other day.  . budesonide-formoterol (SYMBICORT) 80-4.5 MCG/ACT inhaler Inhale 1 puff into the lungs 2 (two) times daily.  . Cholecalciferol 400 units CAPS Take 2 capsules (800 Units total) by mouth daily.  . clorazepate (TRANXENE) 3.75 MG tablet Take 1 tablet (3.75 mg total) by mouth 2 (two) times daily. Pt takes it as needed  . ibuprofen (ADVIL,MOTRIN) 800 MG tablet Take 1 tablet by mouth 2 (two) times daily as needed.  Marland Kitchen ipratropium-albuterol (DUONEB) 0.5-2.5 (3) MG/3ML SOLN Inhale 3 mLs into the lungs QID.  Marland Kitchen latanoprost (XALATAN) 0.005 % ophthalmic solution Place 1 drop into both eyes at bedtime.  Marland Kitchen losartan (COZAAR) 25 MG tablet Take 1  tablet (25 mg total) by mouth daily.   No current facility-administered medications on file prior to visit.     Review of Systems  Constitutional: Positive for unexpected weight change. Negative for activity change, appetite change, chills and fever.  HENT: Negative.   Respiratory: Positive for shortness of breath (baseline- not new). Negative for cough, chest tightness and wheezing.   Cardiovascular: Negative for chest pain and leg swelling.  Gastrointestinal: Negative for abdominal pain, constipation, diarrhea, nausea and vomiting.  Endocrine: Negative.  Negative for cold intolerance, heat intolerance, polydipsia, polyphagia and polyuria.  Genitourinary: Negative for difficulty urinating and dysuria.  Musculoskeletal: Negative.   Neurological: Negative for dizziness, light-headedness and numbness.  Psychiatric/Behavioral: Negative.    Per HPI unless specifically indicated above     Objective:    BP 138/62   Pulse 93   Temp 98.4 F (36.9 C) (Oral)   Resp 16   Ht 5\' 4"  (1.626 m)   Wt 137 lb (62.1 kg)   SpO2 94%   BMI 23.52 kg/m   Wt Readings from Last 3 Encounters:  09/14/16 137 lb (62.1 kg)  08/16/16 140 lb (63.5 kg)  05/08/16 142 lb (64.4 kg)    Physical Exam  Constitutional: She is oriented to person, place, and time. She appears well-developed and well-nourished.  HENT:  Head: Normocephalic and atraumatic.  Neck: Neck supple.  Cardiovascular: Normal rate, regular rhythm and normal pulses.  Exam reveals no gallop and no friction rub.   Murmur heard.  Systolic murmur is present with a grade of 2/6  Pulmonary/Chest: Effort normal and breath sounds normal. She has no wheezes. She exhibits no tenderness.  Abdominal: Soft. Normal appearance and bowel sounds are normal. She exhibits no distension and no mass. There is no tenderness. There is no rebound and no guarding.  Musculoskeletal: Normal range of motion. She exhibits no edema or tenderness.  Lymphadenopathy:    She  has no cervical adenopathy.  Neurological: She is alert and oriented to person, place, and time.  Skin: Skin is warm and dry.   Results for orders placed or performed in visit on 09/14/16  POCT HgB A1C  Result Value Ref Range   Hemoglobin A1C 6.1       Assessment & Plan:   Problem List Items Addressed This Visit      Cardiovascular and Mediastinum   Essential (primary) hypertension    Controlled on recheck. Continue Losartan. Encouraged pt to take daily or to monitor BP closely. Pt is tolerating side effects much better with this medication. Check BMP for renal function today.         Other   Thrombocytopenia ([place])    Recheck platelet count today. Appeared adequate last check. Plan for hematology consult if less than 120.        Relevant Orders   CBC with Differential   Prediabetes - Primary    A1c down to 6.1%.  Doing well. Continue diet and lifestyle changes. STOP metformin today per patient preference. Recheck 3 mos.       Relevant Orders   POCT HgB A1C (Completed)    Other Visit Diagnoses    Weight loss       Likely multi-factorial or metformin related. Due to patient concerns will stop metformin. Reviewed healthy diet. If weight loss continues- consider end stage COPD as a cause. Recheck weight in 1 mos and plan for further work-up with continued loss. Check TSH today.    Relevant Orders   TSH      No orders of the defined types were placed in this encounter.     Follow up plan: Return in about 1 month (around 10/15/2016) for Weight check.

## 2016-09-14 NOTE — Assessment & Plan Note (Signed)
A1c down to 6.1%.  Doing well. Continue diet and lifestyle changes. STOP metformin today per patient preference. Recheck 3 mos.

## 2016-09-19 ENCOUNTER — Encounter: Payer: Self-pay | Admitting: Cardiology

## 2016-09-19 ENCOUNTER — Ambulatory Visit (INDEPENDENT_AMBULATORY_CARE_PROVIDER_SITE_OTHER): Payer: Medicare Other | Admitting: Cardiology

## 2016-09-19 VITALS — BP 148/80 | HR 104 | Ht 64.0 in | Wt 140.8 lb

## 2016-09-19 DIAGNOSIS — R9431 Abnormal electrocardiogram [ECG] [EKG]: Secondary | ICD-10-CM

## 2016-09-19 DIAGNOSIS — I272 Pulmonary hypertension, unspecified: Secondary | ICD-10-CM

## 2016-09-19 DIAGNOSIS — I10 Essential (primary) hypertension: Secondary | ICD-10-CM

## 2016-09-19 DIAGNOSIS — R011 Cardiac murmur, unspecified: Secondary | ICD-10-CM | POA: Diagnosis not present

## 2016-09-19 NOTE — Patient Instructions (Signed)
Testing/Procedures: Your physician has requested that you have an echocardiogram. Echocardiography is a painless test that uses sound waves to create images of your heart. It provides your doctor with information about the size and shape of your heart and how well your heart's chambers and valves are working. This procedure takes approximately one hour. There are no restrictions for this procedure.    Follow-Up: Your physician recommends that you schedule a follow-up appointment as needed with Dr. Yvone Neu. We will call you with results of testing.  It was a pleasure seeing you today here in the office. Please do not hesitate to give [place] a call back if you have any further questions. [place], BSN

## 2016-09-19 NOTE — Progress Notes (Signed)
Cardiology Office Note   Date:  09/19/2016   ID:  Laura Nicholson, DOB 02/04/1932, MRN [id number]  Referring Doctor:  Leata Mouse, NP   Cardiologist:   Wende Bushy, MD   Reason for consultation:  Chief Complaint  Patient presents with  . Follow-up    NO COMPLAINTS.      History of Present Illness: Laura Nicholson is a 82 y.o. female who presents for Follow-up For pulmonary hypertension  Patient has long-standing chronic shortness of breath, due to advanced lung disease. She has had no change with his symptom, no progression. She continues to need oxygen 24 hours.   Patient does not complain of any chest pains, palpitations, headache, fever, cough, colds, abdominal pain, PND, orthopnea, edema.   ROS:  Please see the history of present illness. Aside from mentioned under HPI, all other systems are reviewed and negative.     Past Medical History:  Diagnosis Date  . Allergy   . Arthritis   . Asthma   . Cancer ([place])    in past, 1989 Left Lumpectomy  . Chronic kidney disease    in past  . COPD (chronic obstructive pulmonary disease) ([place])   . Depression   . Glaucoma   . Hypertension     Past Surgical History:  Procedure Laterality Date  . BREAST LUMPECTOMY Left 1989     reports that she has quit smoking. She has quit using smokeless tobacco. She reports that she does not drink alcohol or use drugs.   family history includes Asthma in her sister; CAD in her father; Heart disease in her father.   Current Outpatient Prescriptions  Medication Sig Dispense Refill  . albuterol (PROVENTIL HFA;VENTOLIN HFA) 108 (90 BASE) MCG/ACT inhaler Inhale 2 puffs into the lungs 4 (four) times daily as needed.    Marland Kitchen aspirin 325 MG tablet Take 325 mg by mouth every other day.    . budesonide-formoterol (SYMBICORT) 80-4.5 MCG/ACT inhaler Inhale 1 puff into the lungs 2 (two) times daily.    . Cholecalciferol 400 units CAPS Take 2 capsules (800 Units total) by mouth daily. 30 each 0    . clorazepate (TRANXENE) 3.75 MG tablet Take 1 tablet (3.75 mg total) by mouth 2 (two) times daily. Pt takes it as needed 60 tablet 1  . ibuprofen (ADVIL,MOTRIN) 800 MG tablet Take 1 tablet by mouth 2 (two) times daily as needed.    Marland Kitchen ipratropium-albuterol (DUONEB) 0.5-2.5 (3) MG/3ML SOLN Inhale 3 mLs into the lungs QID.    Marland Kitchen latanoprost (XALATAN) 0.005 % ophthalmic solution Place 1 drop into both eyes at bedtime.    Marland Kitchen losartan (COZAAR) 25 MG tablet Take 1 tablet (25 mg total) by mouth daily. 30 tablet 11   No current facility-administered medications for this visit.     Allergies: Review of patient's allergies indicates no known allergies.    PHYSICAL EXAM: VS:  BP (!) 148/80 (BP Location: Left Arm, Patient Position: Sitting, Cuff Size: Normal)   Pulse (!) 104   Ht 5\' 4"  (1.626 m)   Wt 140 lb 12.8 oz (63.9 kg)   BMI 24.17 kg/m  , Body mass index is 24.17 kg/m. Wt Readings from Last 3 Encounters:  09/19/16 140 lb 12.8 oz (63.9 kg)  09/14/16 137 lb (62.1 kg)  08/16/16 140 lb (63.5 kg)    GENERAL:  well developed, well nourished, not in acute distress HEENT: normocephalic, pink conjunctivae, anicteric sclerae, no xanthelasma, normal dentition, oropharynx clear NECK:  no neck  vein engorgement, JVP normal, no hepatojugular reflux, carotid upstroke brisk and symmetric, no bruit, no thyromegaly, no lymphadenopathy LUNGS:  good respiratory effort, clear to auscultation bilaterally CV:  PMI not displaced, no thrills, no lifts, S1 and S2 within normal limits, no palpable S3 or S4, no rubs, no gallops, soft systolic murmur, nonradiating  ABD:  Soft, nontender, nondistended, normoactive bowel sounds, no abdominal aortic bruit, no hepatomegaly, no splenomegaly MS: nontender back, no kyphosis, no scoliosis, no joint deformities EXT:  2+ DP/PT pulses, no edema, no varicosities, no cyanosis, no clubbing SKIN: warm, nondiaphoretic, normal turgor, no ulcers NEUROPSYCH: alert, oriented to person,  place, and time, sensory/motor grossly intact, normal mood, appropriate affect  Recent Labs: 08/16/2016: ALT 14; BUN 20; Creat 0.93; Potassium 4.2; Sodium 142 09/14/2016: Hemoglobin 13.6; Platelets 170; TSH 1.33   Lipid Panel    Component Value Date/Time   CHOL 183 04/07/2014   HDL 67 04/07/2014   LDLCALC 97 04/07/2014     Other studies Reviewed:  EKG:  EKG  02/23/2016. The ekg ordered was personally reviewed by me and it reveals sinus rhythm, 91 BPM, presence of artifact.   EKG from 09/19/2016 was personally reviewed by me and revealed sinus tachycardia, 104 BPM. Q waves noted in V1 and V2.  Additional studies/ records that were reviewed personally reviewed by me today include:  Echocardiogram 02/25/2016: Left ventricle: The cavity size was normal. Systolic function was  normal. The estimated ejection fraction was in the range of 55%  to 60%. Wall motion was normal; there were no regional wall  motion abnormalities. Doppler parameters are consistent with  abnormal left ventricular relaxation (grade 1 diastolic  dysfunction). - Left atrium: The atrium was normal in size. - Right ventricle: Systolic function was normal. - Pulmonary arteries: Systolic pressure was severely elevated. PA  peak pressure: 77 mm Hg (S).  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   ASSESSMENT AND PLAN:  Cardiac murmur, systolic May be related to TR, and pulmonary hypertension. Pulmonary hypertension is likely related to COPD/emphysema.  Hypertension Patient was placed on losartan by PCP. Recommend continued monitoring.  Pulmonary hypertension Likely in the setting of emphysema. No evidence of CHF on exam.  Abnormal EKG No symptoms of chest pain or angina equivalent. Recommend echocardiogram to evaluate EF and wall motion.   Current medicines are reviewed at length with the patient today.  The patient  does not have concerns regarding medicines.  Labs/ tests ordered today include:  No orders of the defined types were placed in this encounter.   I had a lengthy and detailed discussion with the patient regarding diagnoses, prognosis, diagnostic options, treatment options.  I counseled the patient on importance of lifestyle modification including heart healthy diet, regular physical activity.  Disposition:   FU with undersigned after test as necessary Signed, Wende Bushy, MD  09/19/2016 12:06 PM    [place]

## 2016-09-29 ENCOUNTER — Other Ambulatory Visit: Payer: PRIVATE HEALTH INSURANCE

## 2016-10-18 ENCOUNTER — Ambulatory Visit: Payer: Medicare Other | Admitting: Family Medicine

## 2016-10-20 ENCOUNTER — Ambulatory Visit (INDEPENDENT_AMBULATORY_CARE_PROVIDER_SITE_OTHER): Payer: Medicare Other | Admitting: Family Medicine

## 2016-10-20 ENCOUNTER — Encounter: Payer: Self-pay | Admitting: Family Medicine

## 2016-10-20 VITALS — BP 129/62 | HR 101 | Temp 98.1°F | Resp 16 | Ht 64.0 in | Wt 138.2 lb

## 2016-10-20 DIAGNOSIS — R634 Abnormal weight loss: Secondary | ICD-10-CM | POA: Diagnosis not present

## 2016-10-20 DIAGNOSIS — R630 Anorexia: Secondary | ICD-10-CM | POA: Diagnosis not present

## 2016-10-20 NOTE — Progress Notes (Signed)
Subjective:    Patient ID: Laura Nicholson, female    DOB: [birth year], 82 y.o.   MRN: [id number]  Laura Nicholson is a 82 y.o. female presenting on 10/20/2016 for Weight Check   HPI   WEIGHT LOSS / REDUCED APPETITE: - Recent problem over past 2-3 months, with weight loss initially about 6 lb wt loss, she reports that thought it was due to recent start of HCTZ in 04/2016 but then side effect of weight loss, switched to Losartan and doing well. - Today she reports weight seems stable, here 138 lb down from 137-40, home scale is about 135-137 lbs - Eats ensure 1-2 daily yogurt, does regularly eat 2 meals a day (usually skips lunch, has a late breakfast), has been doing this for >6 months without recent changes - History of left breast cancer s/p lumpectomy, last mammo screening 2 years ago. Denies any symptoms of abnormal breast findings - Admits occasional sadness, fleeting. No prior diagnosis of depression or anxiety. Does admit to decreased energy sometimes. Some prior issues with sleep, but no significant insomnia, does have rx Tranxene 3.48m half tab 2-3 x monthly, infrequently - Admits if weight goes up to >140 lbs sometimes feels difficulty breathing, followed by Cardiology, last ECHO 02/2016, had normal EF 597-35% Grade 1 diastolic dysfunction - Still functions well, enjoys her TV shows and activities at home, lives alone, has sister and brothers, niece nearby for help if needed, has cane for ambulation - On chronic 2L home O2 for COPD - Denies night sweats, chest pain, dyspnea, syncope,  Lightheaded, dizziness  Social History  Substance Use Topics  . 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 Alcohol use No    [facility] 10/20/2016 04/03/2016  Decreased Interest 0 0  Down, Depressed, Hopeless 0 0  PHQ - 2 Score 0 0    Review of Systems Per HPI unless specifically indicated above     Objective:    BP 129/62   Pulse (!) 101   Temp  98.1 F (36.7 C) (Oral)   Resp 16   Ht _0  (1.626 m)   Wt 138 lb 3.2 oz (62.7 kg)   SpO2 91%   BMI 23.72 kg/m   Wt Readings from Last 3 Encounters:  10/20/16 138 lb 3.2 oz (62.7 kg)  09/19/16 140 lb 12.8 oz (63.9 kg)  09/14/16 137 lb (62.1 kg)    Physical Exam  Constitutional: She appears well-developed and well-nourished. No distress.  Thin appearing elderly 89year female, well-appearing, comfortable, cooperative  HENT:  Head: Normocephalic and atraumatic.  Portable O2 tank with [place] in place. Mild dry mucus mem.  Eyes: Conjunctivae are normal.  Neck: Normal range of motion. Neck supple. No thyromegaly present.  Cardiovascular: Regular rhythm and intact distal pulses.   Murmur (13-2/9systolic murmur) heard. Tachycardia  Pulmonary/Chest: Effort normal. No respiratory distress. She has no wheezes. She has no rales.  Mild diminished breath sounds diffusely  Musculoskeletal: Normal range of motion. She exhibits no edema or tenderness.  Lymphadenopathy:    She has no cervical adenopathy.  Neurological: She is alert.  Skin: Skin is warm and dry. No rash noted. She is not diaphoretic.  Psychiatric: She has a normal mood and affect. Her behavior is normal.  Nursing note and vitals reviewed.  Results for orders placed or performed in visit on 09/14/16  CBC with Differential  Result Value Ref Range   WBC 7.9 3.8 - 10.8 K/uL  RBC 5.23 (H) 3.80 - 5.10 MIL/uL   Hemoglobin 13.6 11.7 - 15.5 g/dL   HCT 42.5 35.0 - 45.0 %   MCV 81.3 80.0 - 100.0 fL   MCH 26.0 (L) 27.0 - 33.0 pg   MCHC 32.0 32.0 - 36.0 g/dL   RDW 15.4 (H) 11.0 - 15.0 %   Platelets 170 140 - 400 K/uL   Neutro Abs 5,293 1,500 - 7,800 cells/uL   Lymphs Abs 1,659 850 - 3,900 cells/uL   Monocytes Absolute 790 200 - 950 cells/uL   Eosinophils Absolute 158 15 - 500 cells/uL   Basophils Absolute 0 0 - 200 cells/uL   Neutrophils Relative % 67 %   Lymphocytes Relative 21 %   Monocytes Relative 10 %   Eosinophils Relative  2 %   Basophils Relative 0 %   Smear Review Criteria for review not met   TSH  Result Value Ref Range   TSH 1.33 mIU/L  POCT HgB A1C  Result Value Ref Range   Hemoglobin A1C 6.1       Assessment & Plan:   Problem List Items Addressed This Visit    Weight loss, unintentional    Suspected multifactorial with elderly 63 yr patient, some co morbidities with COPD on chronic O2, seems more driven by poor appetite. Additionally question if recent abrupt wt loss was actually fluid loss with HCTZ and not intolerance to HCTZ, h/o Diastolic Grd 1 CHF, patient admits sometimes difficulty breathing if wt >140 with concern for fluid, followed by Cardiology, last ECHO 02/2016 - Differential, consider possible malignancy, prior breast cancer, no evidence of recurrence, prior mammos  Plan: 1. Closely monitor weight, maximize nutrition, ensure 2. May try Mirtazapine next visit if patient interested, consider Megace 3. Follow-up      Decreased appetite - Primary    Chronic reduced appetite worsening over few months, seems to be multifactorial, history does not seem to suggest dysphagia, nausea or organic etiology. Tolerating ensure, but not adhering to 3 meals daily. - Differential includes possible mild depression but does not seem to have clinical MDD, negative screening, no prior treatment  Plan: 1. Closely monitor weight, continue ensure and encouraged 2 meals + snacks if not going to eat 3 meals daily 2. Strongly consider Mirtazapine for appetite stimulant may help sleep can replace tranxene, could help mood if any underlying component         No orders of the defined types were placed in this encounter.     Follow up plan: Return in about 3 months (around 01/20/2017) for weight loss, .  Laura Putnam, DO [facility] 10/22/2016, 10:16 AM

## 2016-10-20 NOTE — Patient Instructions (Signed)
Thank you for coming in to clinic today.  1. You do have pre diabetes. At this time we do not need to change your diet, try to avoid sugary foods as these are helpful for nutrition and maintaining healthy weight.  Try to keep up at least 2 meals a day, and some snacks throughout, keep up ensure as you are  Check weights at home  We can discuss other medicine Mirtazapine at next visit for help sleep and appetite  Please schedule a follow-up appointment with Dr. Parks Ranger in 3 months for Pre-Diabetes, A1c, Weight Loss, HTN  If you have any other questions or concerns, please feel free to call the clinic or send a message through [place]. You may also schedule an earlier appointment if necessary.  Nobie Putnam, DO [place]

## 2016-10-22 NOTE — Assessment & Plan Note (Signed)
Chronic reduced appetite worsening over few months, seems to be multifactorial, history does not seem to suggest dysphagia, nausea or organic etiology. Tolerating ensure, but not adhering to 3 meals daily. - Differential includes possible mild depression but does not seem to have clinical MDD, negative screening, no prior treatment  Plan: 1. Closely monitor weight, continue ensure and encouraged 2 meals + snacks if not going to eat 3 meals daily 2. Strongly consider Mirtazapine for appetite stimulant may help sleep can replace tranxene, could help mood if any underlying component

## 2016-10-22 NOTE — Assessment & Plan Note (Signed)
Suspected multifactorial with elderly 41 yr patient, some co morbidities with COPD on chronic O2, seems more driven by poor appetite. Additionally question if recent abrupt wt loss was actually fluid loss with HCTZ and not intolerance to HCTZ, h/o Diastolic Grd 1 CHF, patient admits sometimes difficulty breathing if wt >140 with concern for fluid, followed by Cardiology, last ECHO 02/2016 - Differential, consider possible malignancy, prior breast cancer, no evidence of recurrence, prior mammos  Plan: 1. Closely monitor weight, maximize nutrition, ensure 2. May try Mirtazapine next visit if patient interested, consider Megace 3. Follow-up

## 2016-10-31 DIAGNOSIS — J439 Emphysema, unspecified: Secondary | ICD-10-CM | POA: Diagnosis not present

## 2016-10-31 DIAGNOSIS — Z23 Encounter for immunization: Secondary | ICD-10-CM | POA: Diagnosis not present

## 2016-10-31 DIAGNOSIS — J9611 Chronic respiratory failure with hypoxia: Secondary | ICD-10-CM | POA: Diagnosis not present

## 2016-10-31 DIAGNOSIS — R0602 Shortness of breath: Secondary | ICD-10-CM | POA: Diagnosis not present

## 2016-10-31 DIAGNOSIS — J449 Chronic obstructive pulmonary disease, unspecified: Secondary | ICD-10-CM | POA: Diagnosis not present

## 2016-11-01 ENCOUNTER — Other Ambulatory Visit: Payer: Self-pay | Admitting: Family Medicine

## 2016-11-01 MED ORDER — IBUPROFEN 800 MG PO TABS: 800.0000 mg | ORAL_TABLET | Freq: Two times a day (BID) | ORAL | 1 refills | Status: AC | PRN

## 2016-11-10 ENCOUNTER — Other Ambulatory Visit: Payer: PRIVATE HEALTH INSURANCE

## 2016-11-17 IMAGING — CT CT ANGIO CHEST
1 of 2 series · 18 of 30 positions shown · IV contrast (APPLIED)
Comparison: Chest x-ray 05/16/2015.  CT chest 12/21/2005

CLINICAL DATA: Short of breath

EXAM:
CT ANGIOGRAPHY CHEST WITH CONTRAST
TECHNIQUE: Multidetector CT imaging of the chest was performed using the
standard protocol during bolus administration of intravenous
contrast. Multiplanar CT image reconstructions and MIPs were
obtained to evaluate the vascular anatomy.
CONTRAST:  75 mL Isovue 370 IV

[Series 5: pe 1.0 thins · axial · 0.65mm/px · z∈[-342,-38]mm · 18 of 343 slices shown]
[im 20/343  lung]
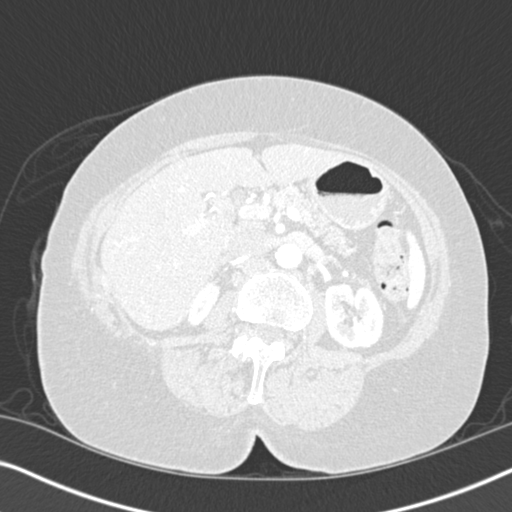
[im 39/343  mediastinal]
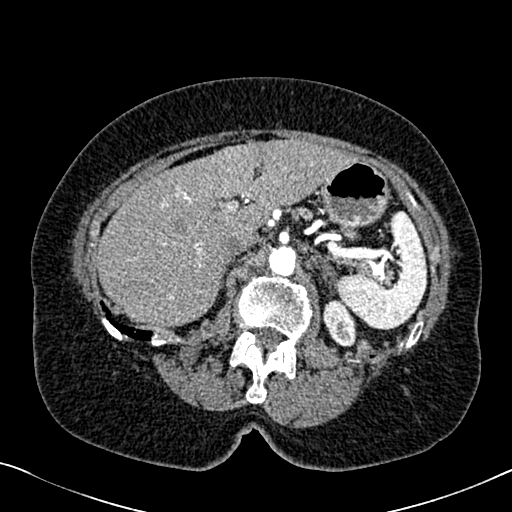
[im 58/343  lung]
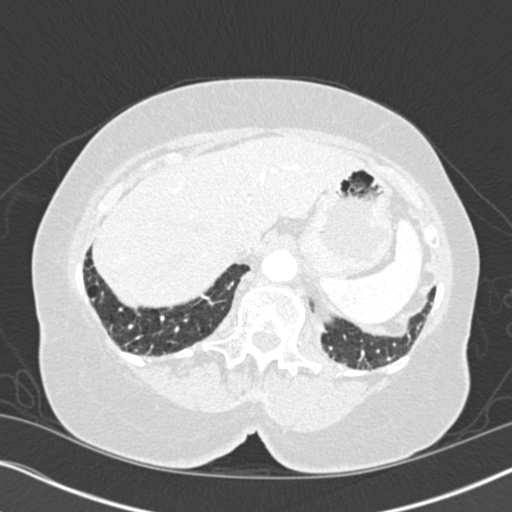
[im 77/343  mediastinal]
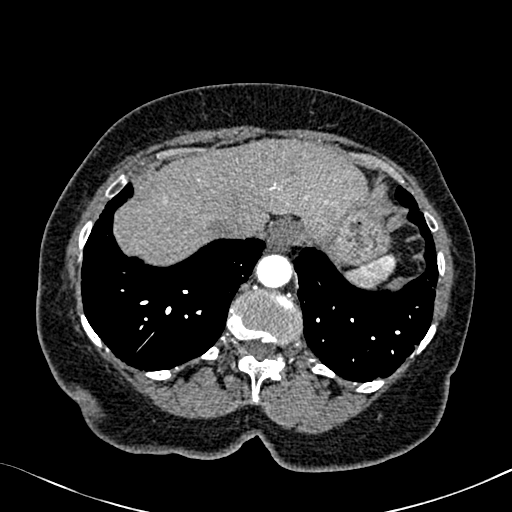
[im 96/343  lung]
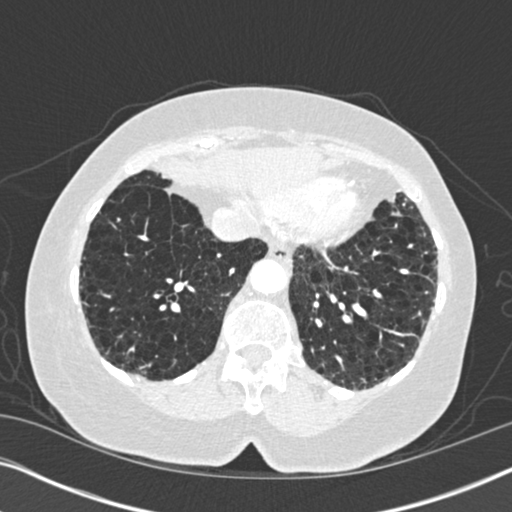
[im 115/343  mediastinal]
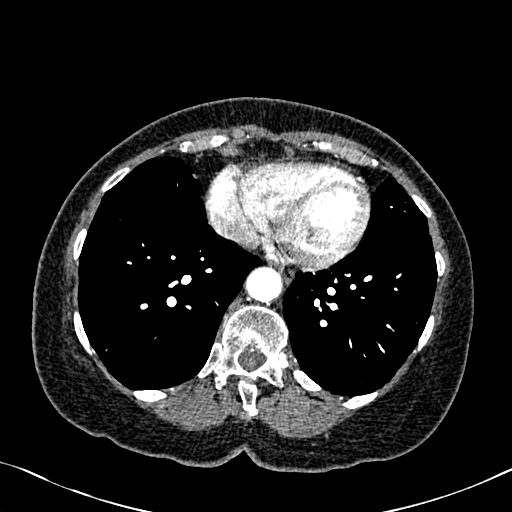
[im 134/343  lung]
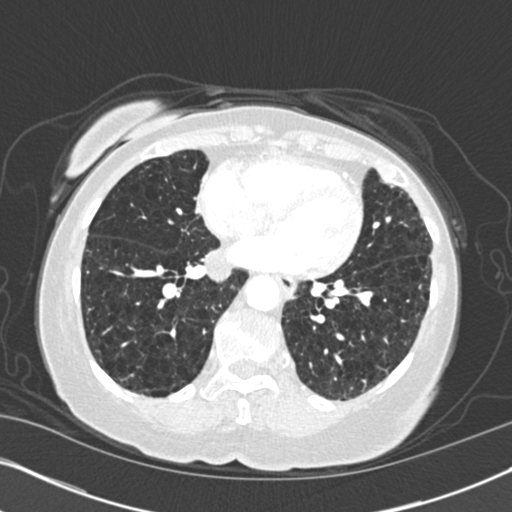
[im 153/343  mediastinal]
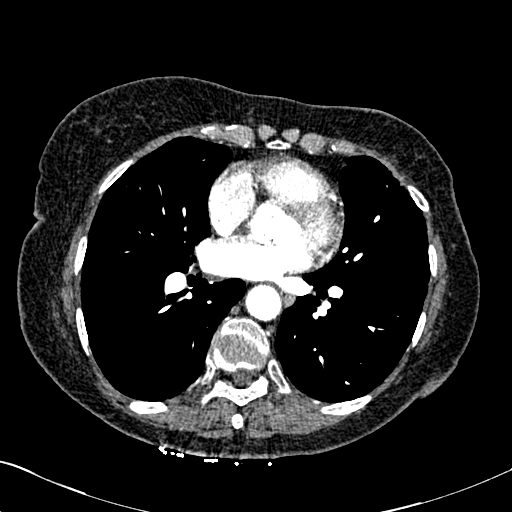
[im 162/343  lung]
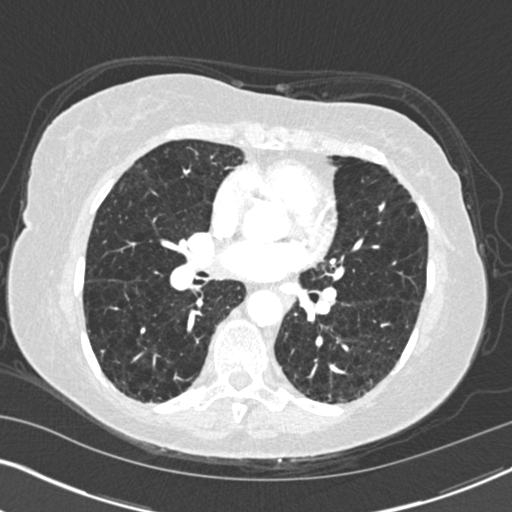
[im 172/343  mediastinal]
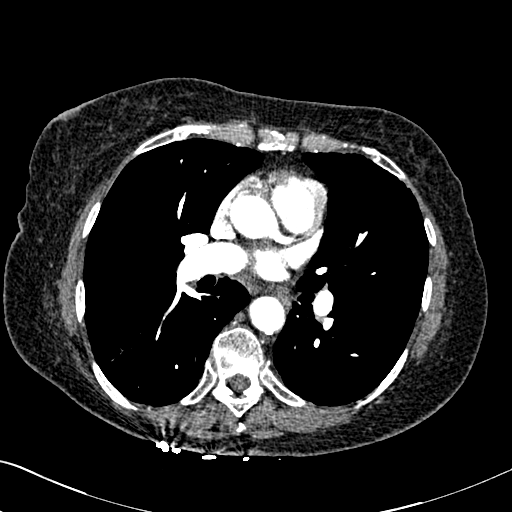
[im 191/343  lung]
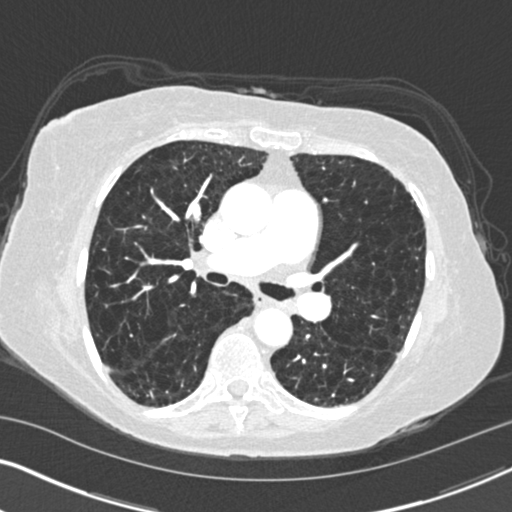
[im 210/343  mediastinal]
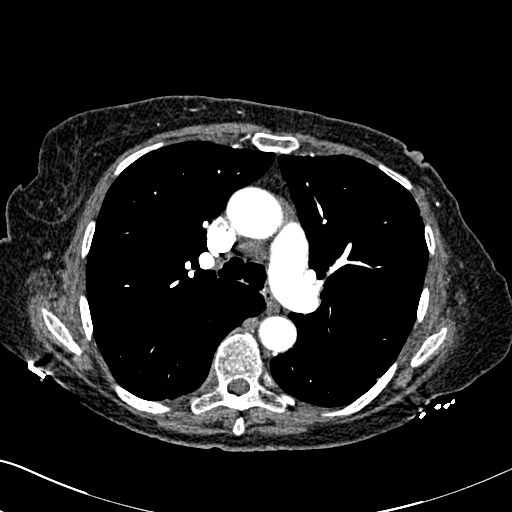
[im 229/343  lung]
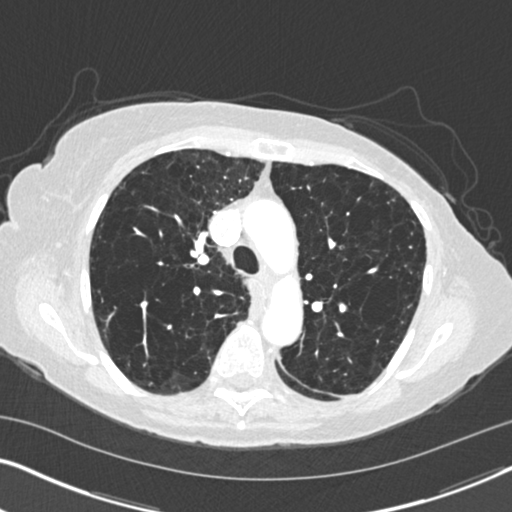
[im 248/343  mediastinal]
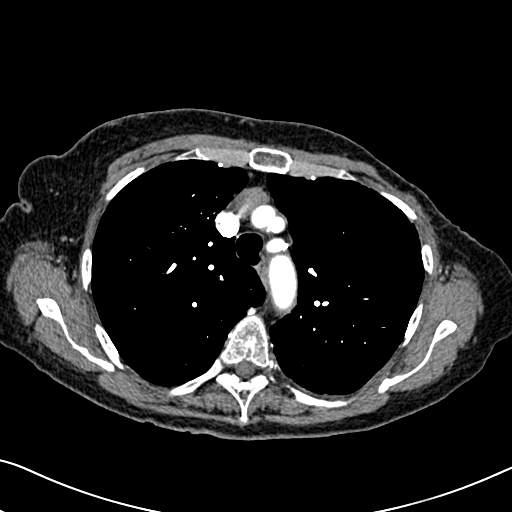
[im 267/343  lung]
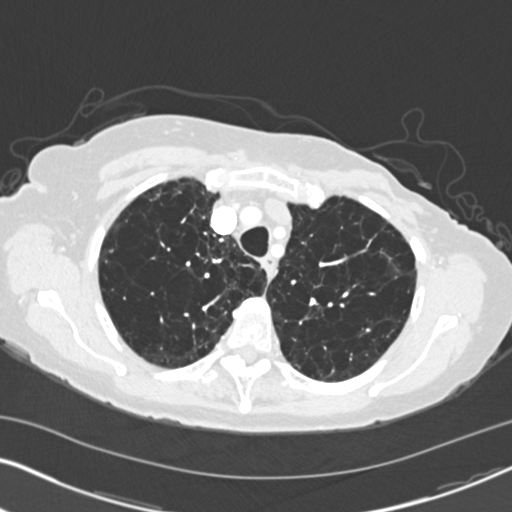
[im 286/343  mediastinal]
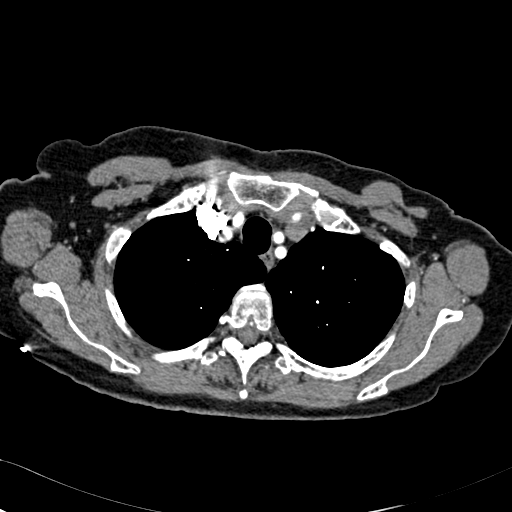
[im 305/343  lung]
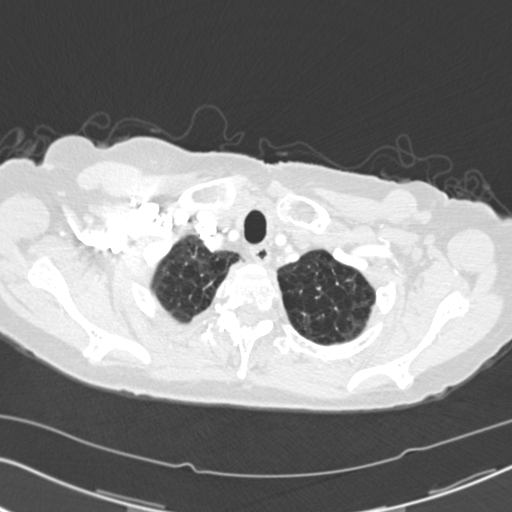
[im 324/343  mediastinal]
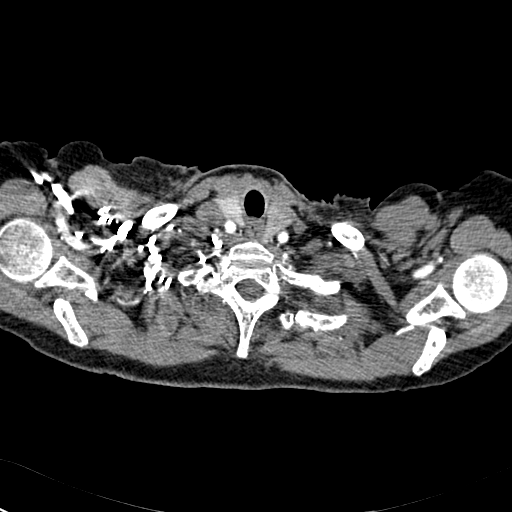

[18 of 30 positions shown; findings below may reference images not displayed]

FINDINGS: Negative for pulmonary embolism. Mild pulmonary enlargement
consistent with pulmonary artery hypertension. This is likely due to
underlying emphysema. Heart size is normal. No pericardial effusion.
Negative for aortic aneurysm or dissection.

COPD with pulmonary hyperinflation. Diffuse emphysema which is
moderate to advanced. This is most severe in the apices. Progression
since 5559.

Lungs are clear without infiltrate or effusion. No mass or
adenopathy.

Negative upper abdomen. Thoracic scoliosis without fracture or bone
lesion.

Review of the MIP images confirms the above findings.
IMPRESSION: Negative for pulmonary embolism

COPD with emphysema.  Pulmonary hypertension secondary to emphysema.

## 2016-11-21 ENCOUNTER — Encounter: Payer: Self-pay | Admitting: Cardiology

## 2016-11-22 DIAGNOSIS — R0602 Shortness of breath: Secondary | ICD-10-CM | POA: Diagnosis not present

## 2016-12-20 ENCOUNTER — Telehealth: Payer: Self-pay | Admitting: Family Medicine

## 2016-12-20 NOTE — Telephone Encounter (Signed)
Received after hours (due to office closed with snow) call from Nursing Call Service at approx 1210 today,  reporting that they were notified by Police of patient deceased at home due to medical issue. Given contact number for local detective, who reported that while at home with family, patient was reported to have acute dyspnea and medical problems, EMS was called and attempted to stabilize patient she was advised to go with them to hospital ED repeatedly, however patient and family declined and would take her on their own since improved, however prior to them able to leave for hospital ED patient had quickly declined again and unresponsive by report and EMS return with attempting to resuscitation efforts that were not successful.  I was given contact # by detective to the local [facility] on call, Dr Izora Ribas. I spoke with Dr Kenton Kingfisher regarding this patient, and reviewed her past medical history. She was a former patient of [facility] by Fredia Sorrow FNP, who now has left the practice, and I had resumed as her PCP and saw her in office once on 10/20/16 to establish care and for variety of medical issues and chronic weight loss. She has significant PMH with diagnosis of severe end stage COPD with chronic respiratory failure on continuous 2L supplemental oxygen, additional history with HTN, chronic diastolic heart failure, and s/p left breast cancer s/p lumpectomy, also former smoker.  From discussion with police officer involved, medical examiner and review of medical history, mutual decision that patient passed of natural progression of medical conditions, likely acute respiratory failure in setting of severe COPD and other conditions. I have agreed to sign the death certificate for this patient, and will await receipt of it from funeral home services that are pending.  Nobie Putnam, [facility] , 12:36 PM

## 2016-12-25 ENCOUNTER — Encounter: Payer: Self-pay | Admitting: Family Medicine

## 2016-12-25 NOTE — Progress Notes (Signed)
Reviewed chart. Previously discussed manner of patient's passing with EMS / Police responders on [id number]. They reported natural cause of death due to acute respiratory symptoms. No report of any trauma or fall immediately related to death. Pronounced dead at home on Jan 16, 2017 at approx 1130 (time) (note a window of time approx 1130 to 1145, before 1200 was given in report from EMS without detail of exact time of death. See summary below of completed information on Death Certificate, completed and signed.  Part I, Immediate cause of death:        a) Acute Respiratory Failure (approx interval onset, minutes)        b) Chronic Respiratory Failure (10 years approx)        c) Chronic Obstructive Pulmonary Disease (>20 years)  Part II, other significant contributing diseases: Hypertension, Breast Cancer, Weight Loss protein calorie malnutrition No autopsy performed. Manner of Death: Natural Case was not referred to Medical examiner. Did tobacco contribute to death - Yes  Completed original death certificate returned to [employer] employee for processing on 12/25/16.  Laura Nicholson, [facility] 12/25/2016, 8:32 AM

## 2017-01-04 DEATH — deceased

## 2017-01-19 ENCOUNTER — Ambulatory Visit: Payer: Medicare Other | Admitting: Family Medicine

## 2017-01-26 ENCOUNTER — Ambulatory Visit: Payer: Medicare Other | Admitting: Family Medicine
